# Patient Record
Sex: Male | Born: 1975 | Race: Black or African American | Hispanic: No | Marital: Married | Smoking: Never smoker
Health system: Southern US, Community
[De-identification: ages and names within clinical notes are randomized; demographics above are authoritative.]

## PROBLEM LIST (undated history)

## (undated) DIAGNOSIS — E119 Type 2 diabetes mellitus without complications: Secondary | ICD-10-CM

## (undated) DIAGNOSIS — Z6841 Body Mass Index (BMI) 40.0 and over, adult: Secondary | ICD-10-CM

## (undated) DIAGNOSIS — E7800 Pure hypercholesterolemia, unspecified: Secondary | ICD-10-CM

## (undated) DIAGNOSIS — I10 Essential (primary) hypertension: Secondary | ICD-10-CM

## (undated) HISTORY — DX: Morbid (severe) obesity due to excess calories: E66.01

## (undated) HISTORY — DX: Essential (primary) hypertension: I10

## (undated) HISTORY — PX: WISDOM TOOTH EXTRACTION: SHX21

## (undated) HISTORY — DX: Body Mass Index (BMI) 40.0 and over, adult: Z684

## (undated) HISTORY — DX: Pure hypercholesterolemia, unspecified: E78.00

## (undated) HISTORY — DX: Type 2 diabetes mellitus without complications: E11.9

---

## 2013-08-18 ENCOUNTER — Ambulatory Visit
Admission: RE | Admit: 2013-08-18 | Discharge: 2013-08-18 | Disposition: A | Discharge status: Home / Self Care | Payer: Managed Care, Other (non HMO) | Source: Ambulatory Visit | Attending: Family Medicine | Admitting: Family Medicine

## 2013-08-18 ENCOUNTER — Other Ambulatory Visit: Payer: Self-pay | Admitting: Family Medicine

## 2013-08-18 DIAGNOSIS — M79604 Pain in right leg: Secondary | ICD-10-CM

## 2014-04-25 ENCOUNTER — Other Ambulatory Visit: Payer: Self-pay | Admitting: Family Medicine

## 2014-04-25 ENCOUNTER — Ambulatory Visit
Admission: RE | Admit: 2014-04-25 | Discharge: 2014-04-25 | Disposition: A | Discharge status: Home / Self Care | Payer: Managed Care, Other (non HMO) | Source: Ambulatory Visit | Attending: Family Medicine | Admitting: Family Medicine

## 2014-04-25 ENCOUNTER — Encounter (INDEPENDENT_AMBULATORY_CARE_PROVIDER_SITE_OTHER): Payer: Self-pay

## 2014-04-25 DIAGNOSIS — M7989 Other specified soft tissue disorders: Secondary | ICD-10-CM

## 2014-04-26 ENCOUNTER — Other Ambulatory Visit: Payer: 59

## 2016-12-28 ENCOUNTER — Telehealth: Payer: Self-pay

## 2016-12-28 NOTE — Telephone Encounter (Signed)
SENT NOTES TO SCHEDULING 

## 2017-01-07 ENCOUNTER — Ambulatory Visit (INDEPENDENT_AMBULATORY_CARE_PROVIDER_SITE_OTHER): Payer: Managed Care, Other (non HMO) | Admitting: Physician Assistant

## 2017-01-07 ENCOUNTER — Encounter: Payer: Self-pay | Admitting: Physician Assistant

## 2017-01-07 VITALS — BP 132/88 | HR 95 | Ht 75.0 in | Wt 328.8 lb

## 2017-01-07 DIAGNOSIS — R079 Chest pain, unspecified: Secondary | ICD-10-CM | POA: Diagnosis not present

## 2017-01-07 DIAGNOSIS — E7800 Pure hypercholesterolemia, unspecified: Secondary | ICD-10-CM

## 2017-01-07 DIAGNOSIS — E119 Type 2 diabetes mellitus without complications: Secondary | ICD-10-CM | POA: Insufficient documentation

## 2017-01-07 DIAGNOSIS — I10 Essential (primary) hypertension: Secondary | ICD-10-CM | POA: Diagnosis not present

## 2017-01-07 NOTE — Patient Instructions (Signed)
Medication Instructions:  Continue current medications  Labwork: None Ordered  Testing/Procedures: Your physician has requested that you have an echocardiogram. Echocardiography is a painless test that uses sound waves to create images of your heart. It provides your doctor with information about the size and shape of your heart and how well your heart's chambers and valves are working. This procedure takes approximately one hour. There are no restrictions for this procedure.  Your physician has requested that you have a Coronary Calcium Score.  Follow-Up: Your physician recommends that you schedule a follow-up appointment in: 6 Months with Dr Tresa EndoKelly   Any Other Special Instructions Will Be Listed Below (If Applicable).   If you need a refill on your cardiac medications before your next appointment, please call your pharmacy.

## 2017-01-07 NOTE — Progress Notes (Signed)
Cardiology Office Note   Date:  01/07/2017   ID:  Joel Wiley, DOB 03/30/1976, MRN [id number]  PCP:  No primary care provider on file.  Cardiologist:  Georgena Spurling, PA-C   Chief Complaint  Patient presents with  . Chest Pain  . Numbness    left arm    History of Present Illness: Joel Wiley is a 41 y.o. male with a history of "hole in his heart" found on echo 2003 in [place], DM, HTN, HLD, obesity.   Joel Wiley presents for cardiology evaluation.  He walks for exercise when the weather is bad and lifts weights 2 x week. He used to weigh more, peak weight 375. He is trying to do better. His exertion level has been normal.   He had some chest pain in November. It was a pressure and got sharp at times. The pressure could last up to 30 minutes. It was random, no association with meals or exercise. Sometimes will get sx the day after eating a big steak. In November/December 5-6 episodes total. He has not tried any rx for this. He can eat steak without symptoms the next day if he drinks a great deal of water (but has nocturia then).  He has also had some L arm tingling. It will occur early in the morning, just getting up. He has also had it while lifting weights. He has 10-20 episodes. He will take ibuprofen occasionally. He does not take it much because he is concerned about GIB since he is on ASA.  He does not have any palpitations, no presyncope or syncope. No consistent LE edema. No consistent exertional symptoms.   He is trying to do better with his DM diet and meds. He is not having any GI symptoms. No bleeding issues.    Past Medical History:  Diagnosis Date  . Diabetes (HCC)   . High cholesterol   . Hypertension   . Morbid obesity with BMI of 40.0-44.9, adult [facility])     Past Surgical History:  Procedure Laterality Date  . WISDOM TOOTH EXTRACTION      Current Outpatient Prescriptions  Medication Sig Dispense Refill  . insulin detemir (LEVEMIR)  100 UNIT/ML injection Inject 20 Units into the skin at bedtime.    Marland Kitchen lisinopril (PRINIVIL,ZESTRIL) 5 MG tablet Take 5 mg by mouth daily.    . metFORMIN (GLUCOPHAGE) 500 MG tablet Take 500 mg by mouth 2 (two) times daily with a meal.    . pravastatin (PRAVACHOL) 20 MG tablet Take 20 mg by mouth daily.     No current facility-administered medications for this visit.     Allergies:   Patient has no known allergies.    Social History:  The patient  reports that he has never smoked. He has never used smokeless tobacco.   Family History:  The patient's family history includes Heart disease in his father; Hyperlipidemia in his father and mother; Hypertension in his father and mother.    ROS:  Please see the history of present illness. All other systems are reviewed and negative.    PHYSICAL EXAM: VS:  BP 132/88 (BP Location: Right Arm)   Pulse 95   Ht 6\' 3"  (1.905 m)   Wt (!) 328 lb 12.8 oz (149.1 kg)   BMI 41.10 kg/m  , BMI Body mass index is 41.1 kg/m. GEN: Well nourished, well developed, male in no acute distress  HEENT: normal for age  Neck: no JVD, no carotid bruit, no  masses Cardiac: RRR; no murmur, no rubs, or gallops Respiratory:  clear to auscultation bilaterally, normal work of breathing GI: soft, nontender, nondistended, + BS MS: no deformity or atrophy; trace LE edema; distal pulses are 2+ in all 4 extremities   Skin: warm and dry, no rash Neuro:  Strength and sensation are intact Psych: euthymic mood, full affect   EKG:  EKG is ordered today. The ekg ordered today demonstrates SR, normal intervals, no ischemic changes   Recent Labs: No results found for requested labs within last 8760 hours.    Lipid Panel No results found for: CHOL, TRIG, HDL, CHOLHDL, VLDL, LDLCALC, LDLDIRECT   Wt Readings from Last 3 Encounters:  01/07/17 (!) 328 lb 12.8 oz (149.1 kg)     Other studies Reviewed: Additional studies/ records that were reviewed today include: Awaiting  faxed records.  ASSESSMENT AND PLAN: Pt and plan reviewed with Dr Tresa EndoKelly, who agrees.  1.  Chest pain, moderate CAD risk: He is not having consistent ischemic symptoms, but is high risk for CAD. Will check a calcium score, if that is zero, no further ischemic eval. If his calcium score is significantly abnormal, may just need cath.  2. HTN: BP is a little high, but he is a little stressed about being here. Management per Dr Allyne GeeSanders.  3. ?ASD or VSD: Check echo, no obvious murmur  Current medicines are reviewed at length with the patient today.  The patient does not have concerns regarding medicines.  The following changes have been made:  no change  Labs/ tests ordered today include:   Orders Placed This Encounter  Procedures  . CT CARDIAC SCORING  . EKG 12-Lead  . ECHOCARDIOGRAM COMPLETE     Disposition:   FU with Dr Tresa EndoKelly in 6 months, sooner if testing is abnormal  Signed, Leanna BattlesBarrett, Lorieann Argueta, PA-C  01/07/2017 6:30 PM    [facility] Phone: [phone]; Fax: [phone]  This note was written with the assistance of speech recognition software. Please excuse any transcriptional errors.

## 2017-01-13 ENCOUNTER — Ambulatory Visit (HOSPITAL_COMMUNITY): Payer: Managed Care, Other (non HMO) | Attending: Internal Medicine

## 2017-01-13 ENCOUNTER — Other Ambulatory Visit: Payer: Self-pay

## 2017-01-13 DIAGNOSIS — I10 Essential (primary) hypertension: Secondary | ICD-10-CM

## 2017-01-13 DIAGNOSIS — Z6841 Body Mass Index (BMI) 40.0 and over, adult: Secondary | ICD-10-CM | POA: Insufficient documentation

## 2017-01-13 DIAGNOSIS — I119 Hypertensive heart disease without heart failure: Secondary | ICD-10-CM | POA: Diagnosis not present

## 2017-01-13 DIAGNOSIS — R079 Chest pain, unspecified: Secondary | ICD-10-CM | POA: Insufficient documentation

## 2017-01-13 DIAGNOSIS — E785 Hyperlipidemia, unspecified: Secondary | ICD-10-CM | POA: Insufficient documentation

## 2017-01-13 DIAGNOSIS — E119 Type 2 diabetes mellitus without complications: Secondary | ICD-10-CM | POA: Insufficient documentation

## 2017-01-13 MED ORDER — PERFLUTREN LIPID MICROSPHERE
1.0000 mL | INTRAVENOUS | Status: AC | PRN
  Administered 2017-01-13: 2 mL via INTRAVENOUS

## 2017-01-15 ENCOUNTER — Ambulatory Visit (INDEPENDENT_AMBULATORY_CARE_PROVIDER_SITE_OTHER)
Admission: RE | Admit: 2017-01-15 | Discharge: 2017-01-15 | Disposition: A | Discharge status: Home / Self Care | Payer: Self-pay | Source: Ambulatory Visit | Attending: Physician Assistant | Admitting: Physician Assistant

## 2017-01-15 DIAGNOSIS — R079 Chest pain, unspecified: Secondary | ICD-10-CM

## 2017-06-22 ENCOUNTER — Other Ambulatory Visit: Payer: Self-pay | Admitting: Nurse Practitioner

## 2017-06-22 DIAGNOSIS — R229 Localized swelling, mass and lump, unspecified: Secondary | ICD-10-CM

## 2017-06-24 ENCOUNTER — Other Ambulatory Visit: Payer: Managed Care, Other (non HMO)

## 2017-06-28 ENCOUNTER — Ambulatory Visit
Admission: RE | Admit: 2017-06-28 | Discharge: 2017-06-28 | Disposition: A | Discharge status: Home / Self Care | Payer: Managed Care, Other (non HMO) | Source: Ambulatory Visit | Attending: Nurse Practitioner | Admitting: Nurse Practitioner

## 2017-06-28 DIAGNOSIS — R229 Localized swelling, mass and lump, unspecified: Secondary | ICD-10-CM

## 2017-11-10 IMAGING — CT CT HEART SCORING
2 series · 16 of 20 positions shown, 18 images · non-contrast
Comparison: None.

CLINICAL DATA: Risk stratification

EXAM:
Coronary Calcium Score
TECHNIQUE: The patient was scanned on a Siemens Somatom 64 slice scanner. Axial
non-contrast 3 mm slices were carried out through the heart. The
data set was analyzed on a dedicated work station and scored using
the Agatson method.

[Series 2: casc 3.0 i36f 2 bestdiast 64 % · axial · 0.38mm/px · z∈[-258,-135]mm · 8 of 53 slices shown, 10 images]
[im 6/53  vessel]
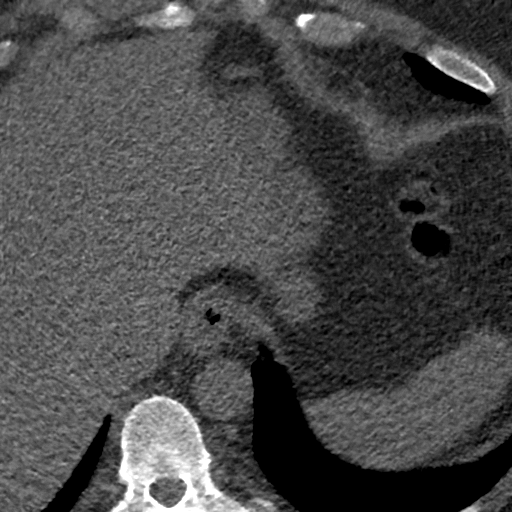
[im 6/53  lung]
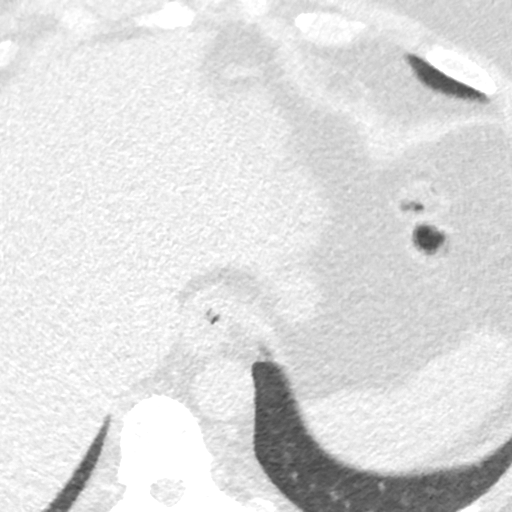
[im 12/53  vessel]
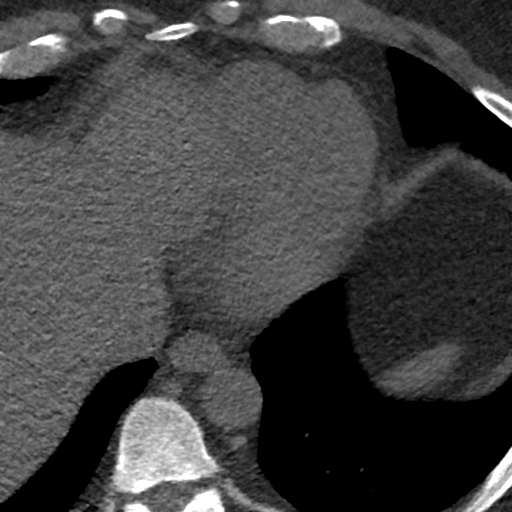
[im 18/53  vessel]
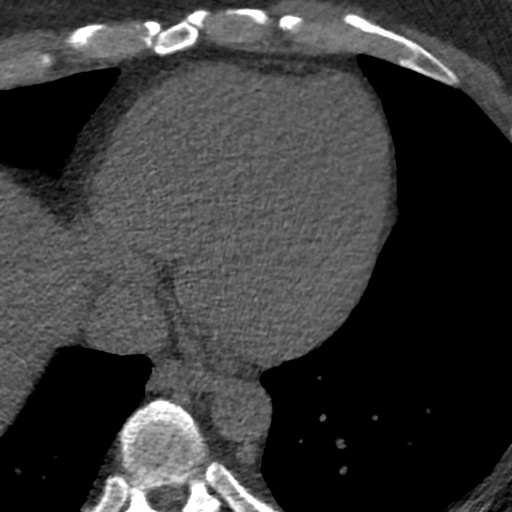
[im 24/53  vessel]
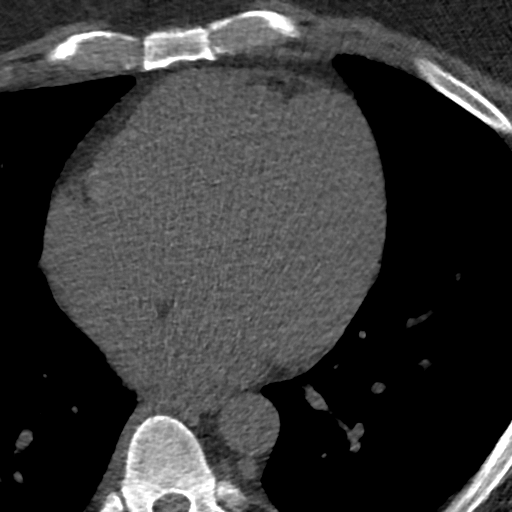
[im 29/53  vessel]
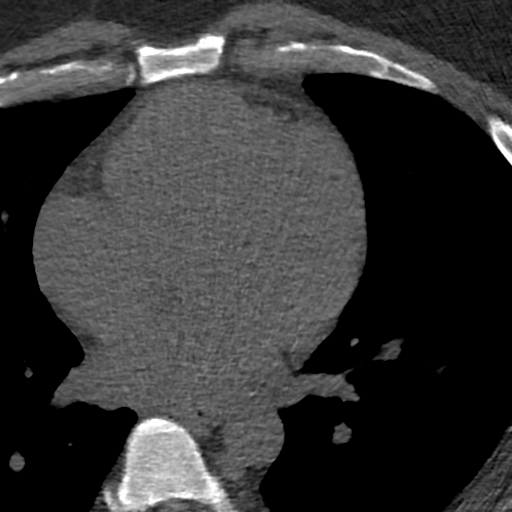
[im 29/53  lung]
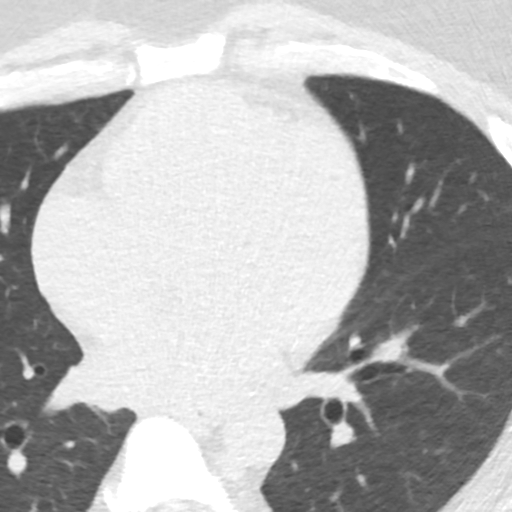
[im 35/53  vessel]
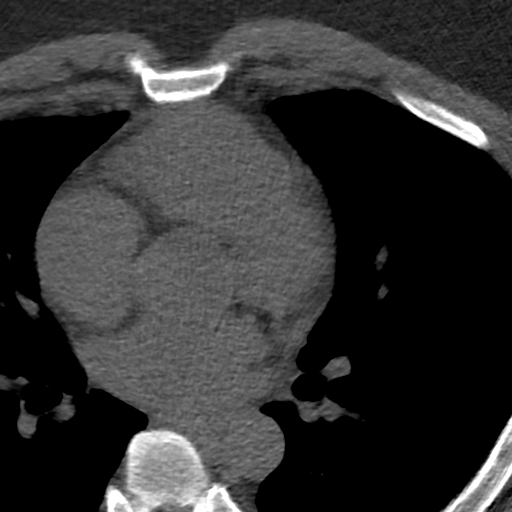
[im 41/53  vessel]
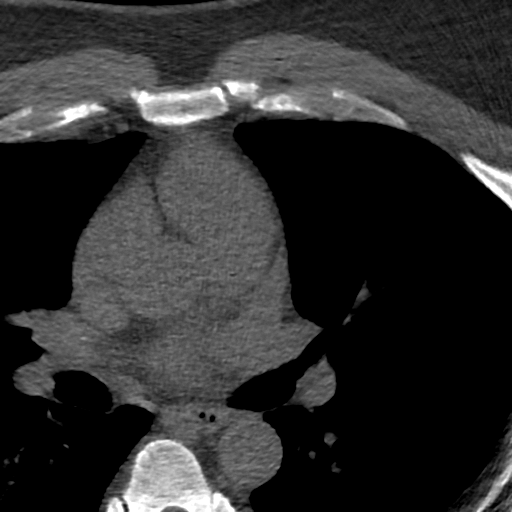
[im 47/53  vessel]
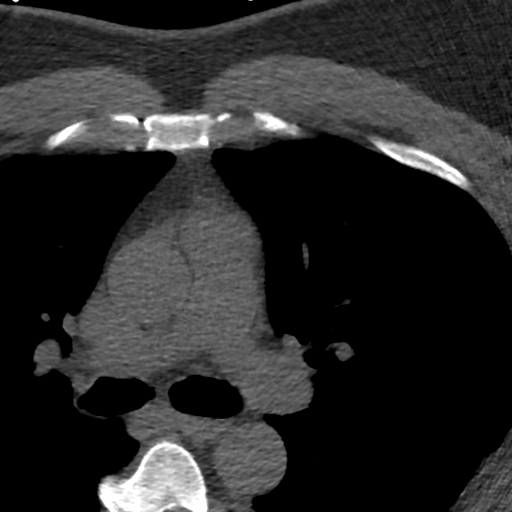

[Series 4: lung st 63 % · axial · 0.74mm/px · z∈[-258,-135]mm · 8 of 53 slices shown]
[im 6/53  lung]
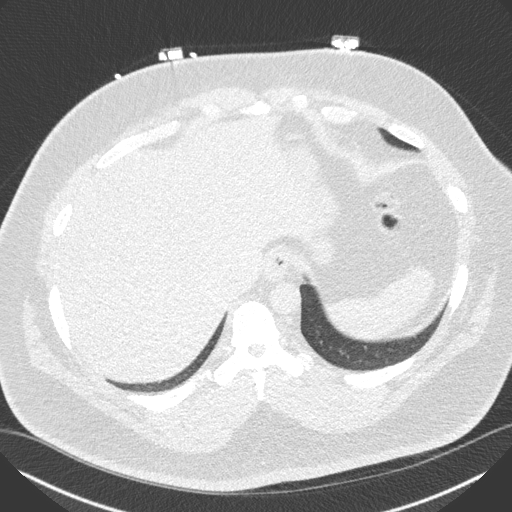
[im 12/53  lung]
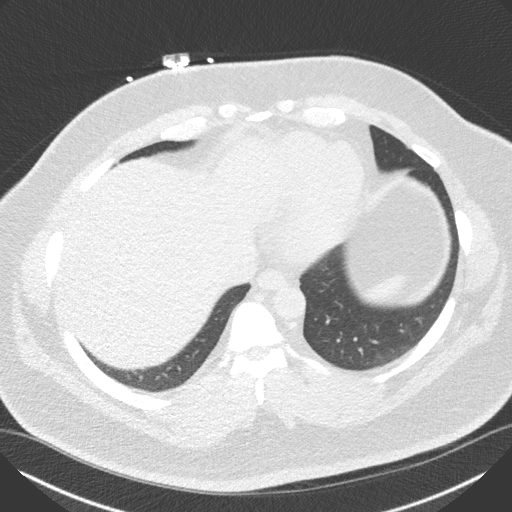
[im 18/53  lung]
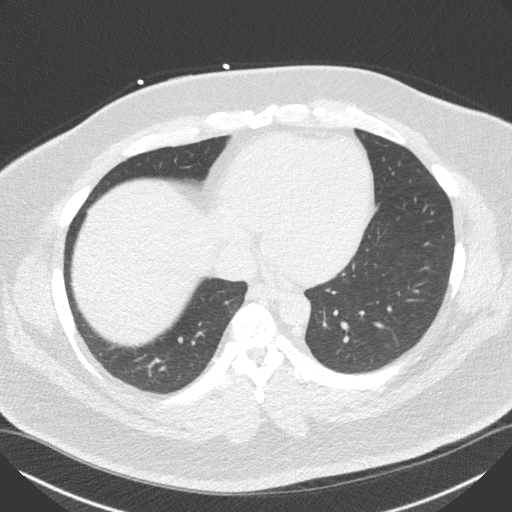
[im 24/53  lung]
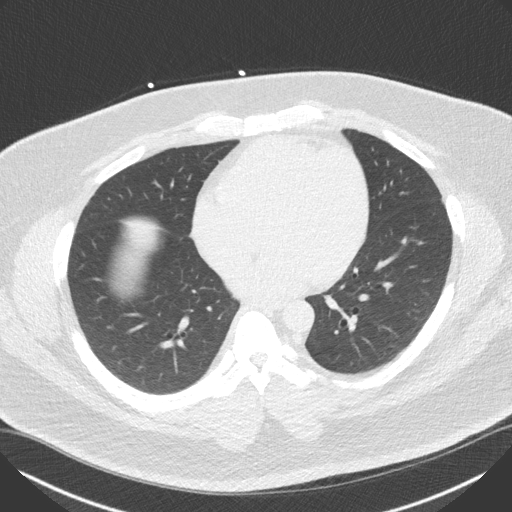
[im 29/53  lung]
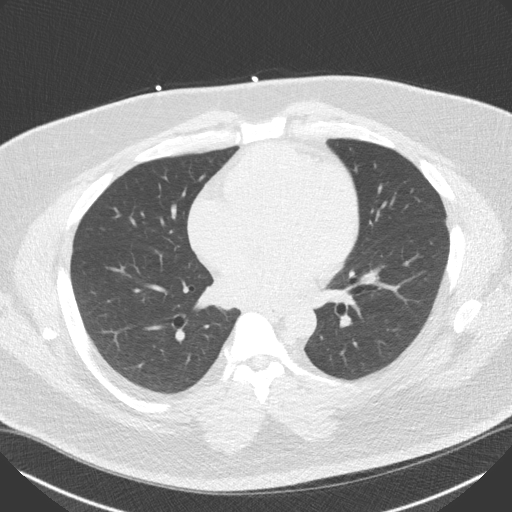
[im 35/53  lung]
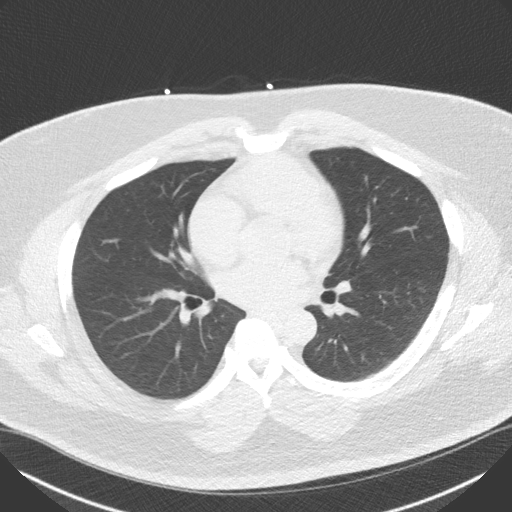
[im 41/53  lung]
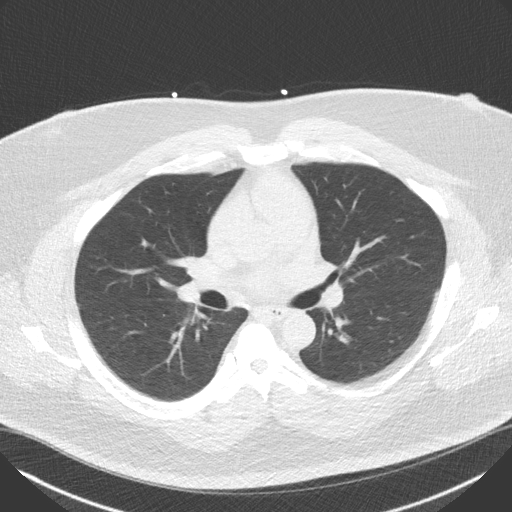
[im 47/53  lung]
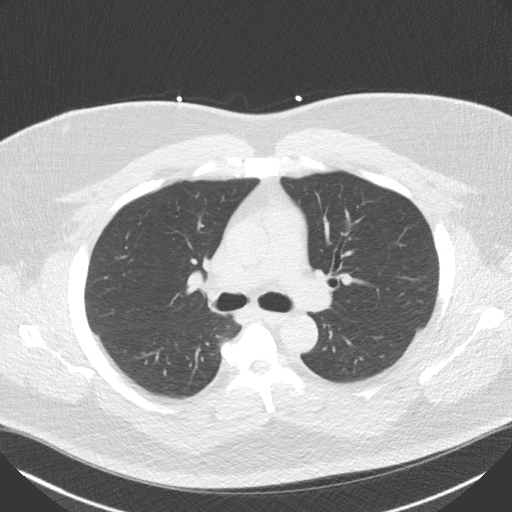

[16 of 20 positions shown; findings below may reference images not displayed]

FINDINGS: Non-cardiac: See separate report from [REDACTED].

Ascending Aorta:  Normal size.  No calcifications.

Pericardium: Normal.

Coronary arteries:  Normal origin.
IMPRESSION: Coronary calcium score of 0. This was 0 percentile for age and sex
matched control.

Krishna Brunner

EXAM:
OVER-READ INTERPRETATION  CT CHEST

The following report is an over-read performed by radiologist Dr.
Amador Schauer [REDACTED] on 01/15/2017. This over-read
does not include interpretation of cardiac or coronary anatomy or
pathology. The coronary calcium score interpretation by the
cardiologist is attached.
FINDINGS: Cardiovascular: Heart is normal size. Visualized aorta normal
caliber.

Mediastinum/Nodes: No adenopathy in the visualized lower mediastinum
or hila.

Lungs/Pleura: Visualized lungs are clear.  No effusions.

Upper Abdomen: Imaging into the upper abdomen shows no acute
findings.

Musculoskeletal: Chest wall soft tissues are unremarkable. No acute
bony abnormality.
IMPRESSION: No acute or significant extracardiac abnormality.

## 2018-01-18 DIAGNOSIS — I10 Essential (primary) hypertension: Secondary | ICD-10-CM | POA: Diagnosis not present

## 2018-01-18 DIAGNOSIS — Z79899 Other long term (current) drug therapy: Secondary | ICD-10-CM | POA: Diagnosis not present

## 2018-01-18 DIAGNOSIS — E1165 Type 2 diabetes mellitus with hyperglycemia: Secondary | ICD-10-CM | POA: Diagnosis not present

## 2018-07-25 DIAGNOSIS — E1165 Type 2 diabetes mellitus with hyperglycemia: Secondary | ICD-10-CM | POA: Diagnosis not present

## 2018-07-25 DIAGNOSIS — I10 Essential (primary) hypertension: Secondary | ICD-10-CM | POA: Diagnosis not present

## 2018-07-25 DIAGNOSIS — Z0000 Encounter for general adult medical examination without abnormal findings: Secondary | ICD-10-CM | POA: Diagnosis not present

## 2018-11-24 ENCOUNTER — Ambulatory Visit: Payer: Self-pay | Admitting: Internal Medicine

## 2018-12-16 ENCOUNTER — Other Ambulatory Visit: Payer: Self-pay | Admitting: Internal Medicine

## 2019-01-01 ENCOUNTER — Other Ambulatory Visit: Payer: Self-pay | Admitting: Internal Medicine

## 2019-02-22 ENCOUNTER — Encounter: Payer: Self-pay | Admitting: Internal Medicine

## 2019-02-22 ENCOUNTER — Other Ambulatory Visit: Payer: Self-pay

## 2019-02-22 ENCOUNTER — Ambulatory Visit: Payer: Commercial Managed Care - PPO | Admitting: Internal Medicine

## 2019-02-22 VITALS — BP 138/78 | HR 91 | Temp 97.8°F | Ht 73.8 in | Wt 328.0 lb

## 2019-02-22 DIAGNOSIS — I10 Essential (primary) hypertension: Secondary | ICD-10-CM | POA: Diagnosis not present

## 2019-02-22 DIAGNOSIS — E7800 Pure hypercholesterolemia, unspecified: Secondary | ICD-10-CM | POA: Diagnosis not present

## 2019-02-22 DIAGNOSIS — M542 Cervicalgia: Secondary | ICD-10-CM | POA: Diagnosis not present

## 2019-02-22 DIAGNOSIS — E1165 Type 2 diabetes mellitus with hyperglycemia: Secondary | ICD-10-CM | POA: Diagnosis not present

## 2019-02-22 DIAGNOSIS — E1169 Type 2 diabetes mellitus with other specified complication: Secondary | ICD-10-CM

## 2019-02-22 MED ORDER — SEMAGLUTIDE(0.25 OR 0.5MG/DOS) 2 MG/1.5ML ~~LOC~~ SOPN: 0.5000 mg | PEN_INJECTOR | SUBCUTANEOUS | 1 refills | Status: DC

## 2019-02-22 NOTE — Progress Notes (Signed)
Subjective:     Patient ID: Joel Wiley , male    DOB: [birth year] , 43 y.o.   MRN: [id number]   Chief Complaint  Patient presents with  . Diabetes    HPI  Pt is here DM FU. Admits he has eaten at lot during the [place] and just started changing his earing habits last week.  Glucose  Has been from 124 fasting -180 post prandial.    Past Medical History:  Diagnosis Date  . Diabetes (HCC)   . High cholesterol   . Hypertension   . Morbid obesity with BMI of 40.0-44.9, adult (HCC)      Family History  Problem Relation Age of Onset  . Hyperlipidemia Mother   . Hypertension Mother   . Heart disease Father        heavy smoker  . Hypertension Father   . Hyperlipidemia Father      Current Outpatient Medications:  .  lisinopril (PRINIVIL,ZESTRIL) 5 MG tablet, Take 5 mg by mouth daily., Disp: , Rfl:  .  metFORMIN (GLUCOPHAGE) 500 MG tablet, Take 500 mg by mouth 2 (two) times daily with a meal., Disp: , Rfl:  .  pravastatin (PRAVACHOL) 20 MG tablet, Take 20 mg by mouth daily., Disp: , Rfl:  .  Semaglutide,0.25 or 0.5MG /DOS, (OZEMPIC, 0.25 OR 0.5 MG/DOSE,) 2 MG/1.5ML SOPN, Inject 0.5 mg into the skin once a week., Disp: 3 pen, Rfl: 1 .  insulin detemir (LEVEMIR) 100 UNIT/ML injection, Inject 20 Units into the skin at bedtime., Disp: , Rfl:    No Known Allergies   Review of Systems  Constitutional: Negative for diaphoresis.  HENT: Negative for congestion.   Respiratory: Negative for cough, chest tightness and shortness of breath.   Cardiovascular: Negative for chest pain, palpitations and leg swelling.  Gastrointestinal: Positive for nausea. Negative for constipation, diarrhea and vomiting.       On occasion gets nausea when he has to have a BM x 1y.   Endocrine: Negative for polydipsia, polyphagia and polyuria.  Musculoskeletal: Positive for neck pain.       Radiates to L shoulder x 8-9 months since wt lifting. He not been evaluated for this.   Skin: Negative for rash and  wound.  Allergic/Immunologic:       Due to d/c coffee  Neurological: Positive for headaches. Negative for numbness.  Hematological: Negative for adenopathy.   Today's Vitals   02/22/19 0900  BP: 138/78  Pulse: 91  Temp: 97.8 F (36.6 C)  TempSrc: Oral  SpO2: 98%  Weight: (!) 328 lb (148.8 kg)  Height: 6' 1.8" (1.875 m)   Body mass index is 42.34 kg/m.  Last wt was 311 lb. From 8/19  Objective:  Physical Exam  Constitutional: he is oriented to person, place, and time. She appears well-developed and well-nourished. No distress.  HENT:  Head: Normocephalic and atraumatic.  Right Ear: External ear normal.  Left Ear: External ear normal.  Nose: Nose normal.  Eyes: Conjunctivae are normal. Right eye exhibits no discharge. Left eye exhibits no discharge. No scleral icterus.  Neck: Neck supple. No thyromegaly present.  No carotid bruits bilaterally  Cardiovascular: Normal rate and regular rhythm.  No murmur heard. Pulmonary/Chest: Effort normal and breath sounds normal. No respiratory distress.  Musculoskeletal: Normal range of motion. he exhibits no edema.  Lymphadenopathy: he has no cervical adenopathy.  Neurological: he is alert and oriented to person, place, and time.  Skin: Skin is warm and dry. Capillary refill takes less  than 2 seconds. No rash noted. he is not diaphoretic.  Psychiatric: he has a normal mood and affect. Her behavior is normal. Judgment and thought content normal.  Nursing note reviewed.   Assessment And Plan:    1. Essential hypertension- stable. May continue same med.  - CMP14 + Anion Gap - CBC no Diff  2. Uncontrolled type 2 diabetes mellitus with hyperglycemia (HCC) Chronic. May continue same med and change if lab is abnormal.  - Hemoglobin A1c    Fu with his PCP in 3-5 months.  3. High cholesterol- chronic - Lipid Profile  4. Neck pain- new. Will come back for this evaluation.      FU in 1-2 weeks to address neck pain.   Encarnacion Scioneaux  RODRIGUEZ-SOUTHWORTH, PA-C

## 2019-02-22 NOTE — Patient Instructions (Signed)
Carbohydrate Counting for Diabetes Mellitus, Adult  Carbohydrate counting is a method of keeping track of how many carbohydrates you eat. Eating carbohydrates naturally increases the amount of sugar (glucose) in the blood. Counting how many carbohydrates you eat helps keep your blood glucose within normal limits, which helps you manage your diabetes (diabetes mellitus). It is important to know how many carbohydrates you can safely have in each meal. This is different for every person. A diet and nutrition specialist (registered dietitian) can help you make a meal plan and calculate how many carbohydrates you should have at each meal and snack. Carbohydrates are found in the following foods:  Grains, such as breads and cereals.  Dried beans and soy products.  Starchy vegetables, such as potatoes, peas, and corn.  Fruit and fruit juices.  Milk and yogurt.  Sweets and snack foods, such as cake, cookies, candy, chips, and soft drinks. How do I count carbohydrates? There are two ways to count carbohydrates in food. You can use either of the methods or a combination of both. Reading "Nutrition Facts" on packaged food The "Nutrition Facts" list is included on the labels of almost all packaged foods and beverages in the [place] It includes:  The serving size.  Information about nutrients in each serving, including the grams (g) of carbohydrate per serving. To use the "Nutrition Facts":  Decide how many servings you will have.  Multiply the number of servings by the number of carbohydrates per serving.  The resulting number is the total amount of carbohydrates that you will be having. Learning standard serving sizes of other foods When you eat carbohydrate foods that are not packaged or do not include "Nutrition Facts" on the label, you need to measure the servings in order to count the amount of carbohydrates:  Measure the foods that you will eat with a food scale or measuring cup, if needed.   Decide how many standard-size servings you will eat.  Multiply the number of servings by 15. Most carbohydrate-rich foods have about 15 g of carbohydrates per serving. ? For example, if you eat 8 oz (170 g) of strawberries, you will have eaten 2 servings and 30 g of carbohydrates (2 servings x 15 g = 30 g).  For foods that have more than one food mixed, such as soups and casseroles, you must count the carbohydrates in each food that is included. The following list contains standard serving sizes of common carbohydrate-rich foods. Each of these servings has about 15 g of carbohydrates:   hamburger bun or  English muffin.   oz (15 mL) syrup.   oz (14 g) jelly.  1 slice of bread.  1 six-inch tortilla.  3 oz (85 g) cooked rice or pasta.  4 oz (113 g) cooked dried beans.  4 oz (113 g) starchy vegetable, such as peas, corn, or potatoes.  4 oz (113 g) hot cereal.  4 oz (113 g) mashed potatoes or  of a large baked potato.  4 oz (113 g) canned or frozen fruit.  4 oz (120 mL) fruit juice.  4-6 crackers.  6 chicken nuggets.  6 oz (170 g) unsweetened dry cereal.  6 oz (170 g) plain fat-free yogurt or yogurt sweetened with artificial sweeteners.  8 oz (240 mL) milk.  8 oz (170 g) fresh fruit or one small piece of fruit.  24 oz (680 g) popped popcorn. Example of carbohydrate counting Sample meal  3 oz (85 g) chicken breast.  6 oz (170 g)   brown rice.  4 oz (113 g) corn.  8 oz (240 mL) milk.  8 oz (170 g) strawberries with sugar-free whipped topping. Carbohydrate calculation 1. Identify the foods that contain carbohydrates: ? Rice. ? Corn. ? Milk. ? Strawberries. 2. Calculate how many servings you have of each food: ? 2 servings rice. ? 1 serving corn. ? 1 serving milk. ? 1 serving strawberries. 3. Multiply each number of servings by 15 g: ? 2 servings rice x 15 g = 30 g. ? 1 serving corn x 15 g = 15 g. ? 1 serving milk x 15 g = 15 g. ? 1 serving  strawberries x 15 g = 15 g. 4. Add together all of the amounts to find the total grams of carbohydrates eaten: ? 30 g + 15 g + 15 g + 15 g = 75 g of carbohydrates total. Summary  Carbohydrate counting is a method of keeping track of how many carbohydrates you eat.  Eating carbohydrates naturally increases the amount of sugar (glucose) in the blood.  Counting how many carbohydrates you eat helps keep your blood glucose within normal limits, which helps you manage your diabetes.  A diet and nutrition specialist (registered dietitian) can help you make a meal plan and calculate how many carbohydrates you should have at each meal and snack. This information is not intended to replace advice given to you by your health care provider. Make sure you discuss any questions you have with your health care provider. Document Released: 11/23/2005 Document Revised: 06/02/2017 Document Reviewed: 05/06/2016 Elsevier Interactive Patient Education  2019 Elsevier Inc.  

## 2019-02-23 ENCOUNTER — Other Ambulatory Visit: Payer: Self-pay | Admitting: Internal Medicine

## 2019-02-23 LAB — LIPID PANEL
CHOLESTEROL TOTAL: 140 mg/dL (ref 100–199)
Chol/HDL Ratio: 3.5 ratio (ref 0.0–5.0)
HDL: 40 mg/dL (ref >39)
LDL Calculated: 84 mg/dL (ref 0–99)
Triglycerides: 82 mg/dL (ref 0–149)
VLDL CHOLESTEROL CAL: 16 mg/dL (ref 5–40)

## 2019-02-23 LAB — CBC
HEMATOCRIT: 43.4 % (ref 37.5–51.0)
HEMOGLOBIN: 15.1 g/dL (ref 13.0–17.7)
MCH: 31.6 pg (ref 26.6–33.0)
MCHC: 34.8 g/dL (ref 31.5–35.7)
MCV: 91 fL (ref 79–97)
Platelets: 175 10*3/uL (ref 150–450)
RBC: 4.78 x10E6/uL (ref 4.14–5.80)
RDW: 11.9 % (ref 11.6–15.4)
WBC: 7.9 10*3/uL (ref 3.4–10.8)

## 2019-02-23 LAB — CMP14 + ANION GAP
A/G RATIO: 1.4 (ref 1.2–2.2)
ALK PHOS: 41 IU/L (ref 39–117)
ALT: 30 IU/L (ref 0–44)
AST: 18 IU/L (ref 0–40)
Albumin: 4.4 g/dL (ref 4.0–5.0)
Anion Gap: 16 mmol/L (ref 10.0–18.0)
BILIRUBIN TOTAL: 0.7 mg/dL (ref 0.0–1.2)
BUN/Creatinine Ratio: 12 (ref 9–20)
BUN: 14 mg/dL (ref 6–24)
CHLORIDE: 101 mmol/L (ref 96–106)
CO2: 22 mmol/L (ref 20–29)
Calcium: 9.5 mg/dL (ref 8.7–10.2)
Creatinine, Ser: 1.13 mg/dL (ref 0.76–1.27)
GFR calc Af Amer: 90 mL/min/{1.73_m2} (ref >59)
GFR calc non Af Amer: 78 mL/min/{1.73_m2} (ref >59)
GLUCOSE: 120 mg/dL — AB (ref 65–99)
Globulin, Total: 3.1 g/dL (ref 1.5–4.5)
POTASSIUM: 4.3 mmol/L (ref 3.5–5.2)
Sodium: 139 mmol/L (ref 134–144)
Total Protein: 7.5 g/dL (ref 6.0–8.5)

## 2019-02-23 LAB — HEMOGLOBIN A1C
Est. average glucose Bld gHb Est-mCnc: 171 mg/dL
HEMOGLOBIN A1C: 7.6 % — AB (ref 4.8–5.6)

## 2019-02-23 MED ORDER — SEMAGLUTIDE (1 MG/DOSE) 2 MG/1.5ML ~~LOC~~ SOPN: 1.0000 mg | PEN_INJECTOR | SUBCUTANEOUS | 2 refills | Status: DC

## 2019-02-27 ENCOUNTER — Other Ambulatory Visit: Payer: Self-pay | Admitting: Internal Medicine

## 2019-03-08 ENCOUNTER — Ambulatory Visit: Payer: Commercial Managed Care - PPO | Admitting: Internal Medicine

## 2019-04-30 ENCOUNTER — Other Ambulatory Visit: Payer: Self-pay | Admitting: Nurse Practitioner

## 2019-05-30 ENCOUNTER — Encounter: Payer: Self-pay | Admitting: Internal Medicine

## 2019-05-30 ENCOUNTER — Ambulatory Visit (INDEPENDENT_AMBULATORY_CARE_PROVIDER_SITE_OTHER): Payer: Commercial Managed Care - PPO | Admitting: Internal Medicine

## 2019-05-30 VITALS — BP 112/84 | HR 92 | Temp 98.4°F | Ht 73.8 in | Wt 322.0 lb

## 2019-05-30 DIAGNOSIS — Z23 Encounter for immunization: Secondary | ICD-10-CM

## 2019-05-30 DIAGNOSIS — Z6841 Body Mass Index (BMI) 40.0 and over, adult: Secondary | ICD-10-CM

## 2019-05-30 DIAGNOSIS — E1165 Type 2 diabetes mellitus with hyperglycemia: Secondary | ICD-10-CM

## 2019-05-30 DIAGNOSIS — I10 Essential (primary) hypertension: Secondary | ICD-10-CM

## 2019-05-30 MED ORDER — TETANUS-DIPHTH-ACELL PERTUSSIS 5-2.5-18.5 LF-MCG/0.5 IM SUSP
0.5000 mL | Freq: Once | INTRAMUSCULAR | Status: AC
  Administered 2019-05-30: 0.5 mL via INTRAMUSCULAR

## 2019-05-30 NOTE — Patient Instructions (Addendum)

## 2019-05-31 LAB — BMP8+EGFR
BUN/Creatinine Ratio: 13 (ref 9–20)
BUN: 15 mg/dL (ref 6–24)
CO2: 22 mmol/L (ref 20–29)
Calcium: 9.9 mg/dL (ref 8.7–10.2)
Chloride: 101 mmol/L (ref 96–106)
Creatinine, Ser: 1.15 mg/dL (ref 0.76–1.27)
GFR calc Af Amer: 88 mL/min/{1.73_m2} (ref >59)
GFR calc non Af Amer: 76 mL/min/{1.73_m2} (ref >59)
Glucose: 106 mg/dL — ABNORMAL HIGH (ref 65–99)
Potassium: 4.4 mmol/L (ref 3.5–5.2)
Sodium: 141 mmol/L (ref 134–144)

## 2019-05-31 LAB — HEMOGLOBIN A1C
Est. average glucose Bld gHb Est-mCnc: 143 mg/dL
Hgb A1c MFr Bld: 6.6 % — ABNORMAL HIGH (ref 4.8–5.6)

## 2019-06-01 NOTE — Progress Notes (Signed)
Subjective:     Patient ID: Joel Wiley , male    DOB: 16-Apr-1976 , 43 y.o.   MRN: [id number]   Chief Complaint  Patient presents with  . Diabetes  . Immunizations    Tdap  . Hypertension    HPI  He is here today for diabetes check.  He is willing to update immunizations today as well.   Diabetes He presents for his follow-up diabetic visit. He has type 2 diabetes mellitus. His disease course has been improving. There are no hypoglycemic associated symptoms. Pertinent negatives for diabetes include no blurred vision and no chest pain. There are no hypoglycemic complications. Risk factors for coronary artery disease include diabetes mellitus, dyslipidemia, hypertension, obesity and male sex. He participates in exercise intermittently. His breakfast blood glucose is taken between 8-9 am. His breakfast blood glucose range is generally 110-130 mg/dl. Eye exam is not current.  Hypertension This is a chronic problem. The current episode started more than 1 year ago. The problem has been gradually improving since onset. The problem is controlled. Pertinent negatives include no blurred vision or chest pain. The current treatment provides moderate improvement.     Past Medical History:  Diagnosis Date  . Diabetes ([place])   . High cholesterol   . Hypertension   . Morbid obesity with BMI of 40.0-44.9, adult (HCC)      Family History  Problem Relation Age of Onset  . Hyperlipidemia Mother   . Hypertension Mother   . Diabetes Mother   . Heart disease Father        heavy smoker  . Hypertension Father   . Hyperlipidemia Father      Current Outpatient Medications:  .  lisinopril (ZESTRIL) 5 MG tablet, TAKE 1 TABLET BY MOUTH EVERY DAY, Disp: 90 tablet, Rfl: 0 .  metFORMIN (GLUCOPHAGE) 500 MG tablet, Take 500 mg by mouth 2 (two) times daily with a meal., Disp: , Rfl:  .  pravastatin (PRAVACHOL) 20 MG tablet, TAKE 1 TABLET BY MOUTH EVERY DAY, Disp: 90 tablet, Rfl: 1 .  Semaglutide, 1  MG/DOSE, (OZEMPIC, 1 MG/DOSE,) 2 MG/1.5ML SOPN, Inject 1 mg into the skin once a week., Disp: 3 pen, Rfl: 2 .  insulin detemir (LEVEMIR) 100 UNIT/ML injection, Inject 20 Units into the skin at bedtime., Disp: , Rfl:    No Known Allergies   Review of Systems  Constitutional: Negative.   Eyes: Negative for blurred vision.  Respiratory: Negative.   Cardiovascular: Negative.  Negative for chest pain.  Gastrointestinal: Negative.   Neurological: Negative.   Psychiatric/Behavioral: Negative.      Today's Vitals   05/30/19 0930  BP: 112/84  Pulse: 92  Temp: 98.4 F (36.9 C)  TempSrc: Oral  Weight: (!) 322 lb (146.1 kg)  Height: 6' 1.8" (1.875 m)   Body mass index is 41.57 kg/m.   Objective:  Physical Exam Vitals signs and nursing note reviewed.  Constitutional:      Appearance: Normal appearance.  Cardiovascular:     Rate and Rhythm: Normal rate and regular rhythm.     Heart sounds: Normal heart sounds.  Pulmonary:     Effort: Pulmonary effort is normal.     Breath sounds: Normal breath sounds.  Skin:    General: Skin is warm.  Neurological:     General: No focal deficit present.     Mental Status: He is alert.  Psychiatric:        Mood and Affect: Mood normal.  Assessment And Plan:     1. Uncontrolled type 2 diabetes mellitus with hyperglycemia ([place])  I will refer him for diabetic eye exam. I will check labs as listed below. Importance of dietary AND exercise AND medication compliance was discussed with the patient.   - Ambulatory referral to Ophthalmology - BMP8+EGFR - Hemoglobin A1c  2. Essential hypertension  Well controlled. He will continue with current meds. He is encouraged to avoid adding salt to his foods.   3. Need for vaccination  - Tdap (BOOSTRIX) injection 0.5 mL  4. Class 3 severe obesity due to excess calories with serious comorbidity and body mass index (BMI) of 40.0 to 44.9 in adult [facility])  Importance of achieving optimal weight  to decrease risk of cardiovascular disease and cancers was discussed with the patient in full detail. He is encouraged to start slowly - start with 10 minutes twice daily at least three to four days per week and to gradually build to 30 minutes five days weekly. He was given tips to incorporate more activity into her daily routine - take stairs when possible, park farther away from his job, grocery stores, etc.    [name], MD    THE PATIENT IS ENCOURAGED TO PRACTICE SOCIAL DISTANCING DUE TO THE COVID-19 PANDEMIC.

## 2019-07-26 ENCOUNTER — Other Ambulatory Visit: Payer: Self-pay | Admitting: Nurse Practitioner

## 2019-08-22 ENCOUNTER — Telehealth: Payer: Self-pay

## 2019-08-22 ENCOUNTER — Other Ambulatory Visit: Payer: Self-pay

## 2019-08-22 MED ORDER — METFORMIN HCL 500 MG PO TABS: 500.0000 mg | ORAL_TABLET | Freq: Two times a day (BID) | ORAL | 0 refills | Status: DC

## 2019-08-22 NOTE — Telephone Encounter (Signed)
I have called pt to see if the pharmacy has contacted him stating his metformin was on the recall list. He stated he did receive a call and email from them about it. I advised him that we will be sending the Rx to [facility] and they should be contacting him soon to verify his info. YRL,RMA

## 2019-08-28 ENCOUNTER — Encounter: Payer: Self-pay | Admitting: Internal Medicine

## 2019-09-01 ENCOUNTER — Encounter: Payer: Self-pay | Admitting: Internal Medicine

## 2019-09-08 ENCOUNTER — Other Ambulatory Visit: Payer: Self-pay | Admitting: Internal Medicine

## 2019-09-14 ENCOUNTER — Encounter: Payer: Self-pay | Admitting: Internal Medicine

## 2019-10-04 ENCOUNTER — Other Ambulatory Visit: Payer: Self-pay | Admitting: Internal Medicine

## 2019-10-05 ENCOUNTER — Other Ambulatory Visit: Payer: Self-pay

## 2019-10-05 ENCOUNTER — Ambulatory Visit (INDEPENDENT_AMBULATORY_CARE_PROVIDER_SITE_OTHER): Payer: Commercial Managed Care - PPO | Admitting: Internal Medicine

## 2019-10-05 ENCOUNTER — Encounter: Payer: Self-pay | Admitting: Internal Medicine

## 2019-10-05 VITALS — BP 130/84 | HR 72 | Temp 98.0°F | Wt 318.0 lb

## 2019-10-05 DIAGNOSIS — E7800 Pure hypercholesterolemia, unspecified: Secondary | ICD-10-CM | POA: Diagnosis not present

## 2019-10-05 DIAGNOSIS — Z0000 Encounter for general adult medical examination without abnormal findings: Secondary | ICD-10-CM | POA: Diagnosis not present

## 2019-10-05 DIAGNOSIS — Z23 Encounter for immunization: Secondary | ICD-10-CM | POA: Diagnosis not present

## 2019-10-05 DIAGNOSIS — I10 Essential (primary) hypertension: Secondary | ICD-10-CM

## 2019-10-05 DIAGNOSIS — Z6841 Body Mass Index (BMI) 40.0 and over, adult: Secondary | ICD-10-CM

## 2019-10-05 DIAGNOSIS — E1169 Type 2 diabetes mellitus with other specified complication: Secondary | ICD-10-CM

## 2019-10-05 DIAGNOSIS — R809 Proteinuria, unspecified: Secondary | ICD-10-CM

## 2019-10-05 DIAGNOSIS — Z1211 Encounter for screening for malignant neoplasm of colon: Secondary | ICD-10-CM | POA: Diagnosis not present

## 2019-10-05 LAB — POCT URINALYSIS DIPSTICK
Bilirubin, UA: NEGATIVE
Blood, UA: NEGATIVE
Glucose, UA: NEGATIVE
Ketones, UA: NEGATIVE
Leukocytes, UA: NEGATIVE
Nitrite, UA: NEGATIVE
Protein, UA: POSITIVE — AB
Spec Grav, UA: 1.02 (ref 1.010–1.025)
Urobilinogen, UA: 0.2 E.U./dL
pH, UA: 5 (ref 5.0–8.0)

## 2019-10-05 LAB — POCT UA - MICROALBUMIN
Albumin/Creatinine Ratio, Urine, POC: 30
Creatinine, POC: 200 mg/dL
Microalbumin Ur, POC: 30 mg/L

## 2019-10-05 LAB — POC HEMOCCULT BLD/STL (OFFICE/1-CARD/DIAGNOSTIC): Fecal Occult Blood, POC: NEGATIVE

## 2019-10-05 NOTE — Addendum Note (Signed)
Addended by: Candiss Norse T on: 10/05/2019 04:26 PM   Modules accepted: Orders

## 2019-10-05 NOTE — Progress Notes (Addendum)
Subjective:     Patient ID: Joel Wiley , male    DOB: June 26, 1976 , 43 y.o.   MRN: [id number]   Chief Complaint  Patient presents with  . Annual Exam    HPI Here for yearly physical. Has stated a wt loss chalage with a friend 3 weeks ago and does cardio 20 min 3x/week, and wt lifting qd for 45 min. Last eye exam was this month, and normal.    Past Medical History:  Diagnosis Date  . Diabetes ([place])   . High cholesterol   . Hypertension   . Morbid obesity with BMI of 40.0-44.9, adult (HCC)      Family History  Problem Relation Age of Onset  . Hyperlipidemia Mother   . Hypertension Mother   . Diabetes Mother   . Heart disease Father        heavy smoker  . Hypertension Father   . Hyperlipidemia Father      Current Outpatient Medications:  .  lisinopril (ZESTRIL) 5 MG tablet, TAKE 1 TABLET BY MOUTH EVERY DAY, Disp: 90 tablet, Rfl: 0 .  metFORMIN (GLUCOPHAGE) 500 MG tablet, Take 1 tablet (500 mg total) by mouth 2 (two) times daily with a meal., Disp: 90 tablet, Rfl: 0 .  pravastatin (PRAVACHOL) 20 MG tablet, TAKE 1 TABLET BY MOUTH EVERY DAY, Disp: 90 tablet, Rfl: 1 .  Semaglutide, 1 MG/DOSE, (OZEMPIC, 1 MG/DOSE,) 2 MG/1.5ML SOPN, Inject 1 mg into the skin once a week., Disp: 3 pen, Rfl: 2 .  insulin detemir (LEVEMIR) 100 UNIT/ML injection, Inject 20 Units into the skin at bedtime., Disp: , Rfl:  .  metFORMIN (GLUCOPHAGE-XR) 500 MG 24 hr tablet, TAKE 1 TABLET BY MOUTH TWICE A DAY WITH MEALS (Patient not taking: Reported on 10/05/2019), Disp: 180 tablet, Rfl: 0   No Known Allergies   Review of Systems  + for having L posterior shoulder pain off and on since doing wt lifting. No paresthesia. Is working on loosing wt. The rest is neg.  Today's Vitals   10/05/19 0949  BP: 130/84  Pulse: 72  Temp: 98 F (36.7 C)  TempSrc: Oral  Weight: (!) 318 lb (144.2 kg)   Body mass index is 41.05 kg/m.   Objective:  Physical Exam  Wt is down 4 lbs since June. BP 130/84 (BP  Location: Left Arm, Patient Position: Sitting, Cuff Size: Large)   Pulse 72   Temp 98 F (36.7 C) (Oral)   Wt (!) 318 lb (144.2 kg)   BMI 41.05 kg/m  Exam and history done in paper since there was no electricity or wifi.  General Appearance:    Alert, cooperative, no distress, appears stated age  Head:    Normocephalic, without obvious abnormality, atraumatic  Eyes:    PERRL, conjunctiva/corneas clear, EOM's intact, fundi    benign, both eyes       Ears:    Normal TM's and external ear canals, both ears  Nose:   Nares normal, septum midline, mucosa normal, no drainage   or sinus tenderness  Throat:   Lips, mucosa, and tongue normal; teeth and gums normal  Neck:   Supple, symmetrical, trachea midline, no adenopathy;       thyroid:  No enlargement/tenderness/nodules; no carotid   bruit   Back:     Symmetric, no curvature, ROM normal. Has mild tenderness on his L mid trap with palpation.   Lungs:     Clear to auscultation bilaterally, respirations unlabored  Chest wall:  No tenderness or deformity. Has gynecomastia.   Heart:    Regular rate and rhythm, S1 and S2 normal, no murmur, rub   or gallop  Abdomen:     Soft, non-tender, bowel sounds active all four quadrants,    no masses, no organomegaly  Genitalia:    Normal male without lesion, discharge or tenderness  Rectal:    Normal tone, normal prostate, no masses or tenderness;   guaiac negative stool  Extremities:   Extremities normal, atraumatic, no cyanosis or edema  Pulses:   2+ and symmetric all extremities  Skin:   Skin color, texture, turgor normal, no rashes or lesions  Lymph nodes:   Cervical, supraclavicular, and axillary nodes normal  Neurologic:   CNII-XII intact. Normal strength, sensation and reflexes      Throughout. Normal Romberg, tandem gait, nose to finger, tip toe and heel gait.    EKG- NSR UA- + for protein but unknown how much, since resulted only as positive,  and + microalbumin.  Assessment And Plan:     1. Routine medical exam- routine. FU 1 y     Flu shot was given.  2. Type 2 diabetes mellitus with other specified complication, unspecified whether long term insulin use (HCC)- chronic. May continue same med. - Hemoglobin A1c; Future   FU in 3-6 months with PCP 3. Essential hypertension- chronic and stable. May continue current med - CMP14 + Anion Gap - CBC no Diff; Future    FU 3-6 months with PP 4. High cholesterol- chronic. May continue same med - Lipid Profile; Future  5. Obese Class 3 with comorbidity chronic- Is working on wt loss and was encouraged to continue this.  He will come back on Monday for labs.  AVS not printed since we did not have electricity or Wifi during his visit.  6- Microalbuminuria/ Proteinuria- new. We dont have any past UA's in epic to compare with today's.      I will have pt hydrate better when he comes next week for blood work and we can repeat the UA and Microalbumin.  Keidy Thurgood RODRIGUEZ-SOUTHWORTH, PA-C    THE PATIENT IS ENCOURAGED TO PRACTICE SOCIAL DISTANCING DUE TO THE COVID-19 PANDEMIC.

## 2019-10-07 ENCOUNTER — Encounter: Payer: Self-pay | Admitting: Internal Medicine

## 2019-10-07 DIAGNOSIS — R809 Proteinuria, unspecified: Secondary | ICD-10-CM | POA: Insufficient documentation

## 2019-10-07 NOTE — Addendum Note (Signed)
Addended by: Nicki Guadalajara on: 10/07/2019 10:31 AM   Modules accepted: Orders

## 2019-10-09 NOTE — Addendum Note (Signed)
Addended by: Jacqualine Mau on: 10/09/2019 08:45 AM   Modules accepted: Orders

## 2019-10-10 LAB — CMP14 + ANION GAP
ALT: 24 IU/L (ref 0–44)
AST: 17 IU/L (ref 0–40)
Albumin/Globulin Ratio: 1.2 (ref 1.2–2.2)
Albumin: 4.2 g/dL (ref 4.0–5.0)
Alkaline Phosphatase: 46 IU/L (ref 39–117)
Anion Gap: 13 mmol/L (ref 10.0–18.0)
BUN/Creatinine Ratio: 10 (ref 9–20)
BUN: 12 mg/dL (ref 6–24)
Bilirubin Total: 0.8 mg/dL (ref 0.0–1.2)
CO2: 22 mmol/L (ref 20–29)
Calcium: 9.7 mg/dL (ref 8.7–10.2)
Chloride: 102 mmol/L (ref 96–106)
Creatinine, Ser: 1.26 mg/dL (ref 0.76–1.27)
GFR calc Af Amer: 79 mL/min/{1.73_m2} (ref >59)
GFR calc non Af Amer: 68 mL/min/{1.73_m2} (ref >59)
Globulin, Total: 3.6 g/dL (ref 1.5–4.5)
Glucose: 119 mg/dL — ABNORMAL HIGH (ref 65–99)
Potassium: 4 mmol/L (ref 3.5–5.2)
Sodium: 137 mmol/L (ref 134–144)
Total Protein: 7.8 g/dL (ref 6.0–8.5)

## 2019-10-10 LAB — CBC
Hematocrit: 44.4 % (ref 37.5–51.0)
Hemoglobin: 15 g/dL (ref 13.0–17.7)
MCH: 30.4 pg (ref 26.6–33.0)
MCHC: 33.8 g/dL (ref 31.5–35.7)
MCV: 90 fL (ref 79–97)
Platelets: 155 10*3/uL (ref 150–450)
RBC: 4.93 x10E6/uL (ref 4.14–5.80)
RDW: 12 % (ref 11.6–15.4)
WBC: 6.9 10*3/uL (ref 3.4–10.8)

## 2019-10-10 LAB — T3, FREE: T3, Free: 3.6 pg/mL (ref 2.0–4.4)

## 2019-10-10 LAB — LIPID PANEL
Chol/HDL Ratio: 4 ratio (ref 0.0–5.0)
Cholesterol, Total: 171 mg/dL (ref 100–199)
HDL: 43 mg/dL (ref >39)
LDL Chol Calc (NIH): 112 mg/dL — ABNORMAL HIGH (ref 0–99)
Triglycerides: 86 mg/dL (ref 0–149)
VLDL Cholesterol Cal: 16 mg/dL (ref 5–40)

## 2019-10-10 LAB — URINALYSIS, ROUTINE W REFLEX MICROSCOPIC
Bilirubin, UA: NEGATIVE
Glucose, UA: NEGATIVE
Ketones, UA: NEGATIVE
Leukocytes,UA: NEGATIVE
Nitrite, UA: NEGATIVE
Protein,UA: NEGATIVE
RBC, UA: NEGATIVE
Specific Gravity, UA: 1.007 (ref 1.005–1.030)
Urobilinogen, Ur: 0.2 mg/dL (ref 0.2–1.0)
pH, UA: 6 (ref 5.0–7.5)

## 2019-10-10 LAB — T4, FREE: Free T4: 1.29 ng/dL (ref 0.82–1.77)

## 2019-10-10 LAB — HEMOGLOBIN A1C
Est. average glucose Bld gHb Est-mCnc: 157 mg/dL
Hgb A1c MFr Bld: 7.1 % — ABNORMAL HIGH (ref 4.8–5.6)

## 2019-10-10 LAB — MICROALBUMIN / CREATININE URINE RATIO
Creatinine, Urine: 40.1 mg/dL
Microalb/Creat Ratio: <7 mg/g creat (ref 0–29)
Microalbumin, Urine: <3 ug/mL

## 2019-10-10 LAB — PSA: Prostate Specific Ag, Serum: 0.9 ng/mL (ref 0.0–4.0)

## 2019-10-10 LAB — TSH: TSH: 2.65 u[IU]/mL (ref 0.450–4.500)

## 2019-10-15 ENCOUNTER — Encounter: Payer: Self-pay | Admitting: Internal Medicine

## 2019-10-16 ENCOUNTER — Encounter: Payer: Self-pay | Admitting: Internal Medicine

## 2019-11-13 DIAGNOSIS — M7540 Impingement syndrome of unspecified shoulder: Secondary | ICD-10-CM | POA: Insufficient documentation

## 2019-11-13 DIAGNOSIS — M542 Cervicalgia: Secondary | ICD-10-CM | POA: Insufficient documentation

## 2019-11-24 ENCOUNTER — Other Ambulatory Visit: Payer: Self-pay | Admitting: Internal Medicine

## 2019-12-27 ENCOUNTER — Other Ambulatory Visit: Payer: Self-pay | Admitting: Internal Medicine

## 2020-01-11 ENCOUNTER — Ambulatory Visit: Payer: Commercial Managed Care - PPO | Admitting: Internal Medicine

## 2020-01-11 ENCOUNTER — Encounter: Payer: Self-pay | Admitting: Internal Medicine

## 2020-01-15 ENCOUNTER — Other Ambulatory Visit: Payer: Self-pay

## 2020-01-15 ENCOUNTER — Ambulatory Visit: Payer: Commercial Managed Care - PPO | Admitting: Internal Medicine

## 2020-01-15 ENCOUNTER — Encounter: Payer: Self-pay | Admitting: Internal Medicine

## 2020-01-15 VITALS — BP 112/68 | HR 107 | Temp 98.1°F | Ht 73.8 in | Wt 326.2 lb

## 2020-01-15 DIAGNOSIS — E1169 Type 2 diabetes mellitus with other specified complication: Secondary | ICD-10-CM | POA: Diagnosis not present

## 2020-01-15 DIAGNOSIS — I10 Essential (primary) hypertension: Secondary | ICD-10-CM | POA: Diagnosis not present

## 2020-01-15 DIAGNOSIS — Z6841 Body Mass Index (BMI) 40.0 and over, adult: Secondary | ICD-10-CM

## 2020-01-15 MED ORDER — OZEMPIC (1 MG/DOSE) 2 MG/1.5ML ~~LOC~~ SOPN: 1.0000 mg | PEN_INJECTOR | SUBCUTANEOUS | 5 refills | Status: DC

## 2020-01-15 MED ORDER — METFORMIN HCL 500 MG PO TABS: 500.0000 mg | ORAL_TABLET | Freq: Two times a day (BID) | ORAL | 2 refills | Status: DC

## 2020-01-15 MED ORDER — LISINOPRIL 5 MG PO TABS: 5.0000 mg | ORAL_TABLET | Freq: Every day | ORAL | 2 refills | Status: DC

## 2020-01-15 NOTE — Patient Instructions (Signed)

## 2020-01-16 LAB — BMP8+EGFR
BUN/Creatinine Ratio: 11 (ref 9–20)
BUN: 10 mg/dL (ref 6–24)
CO2: 22 mmol/L (ref 20–29)
Calcium: 9 mg/dL (ref 8.7–10.2)
Chloride: 103 mmol/L (ref 96–106)
Creatinine, Ser: 0.95 mg/dL (ref 0.76–1.27)
GFR calc Af Amer: 111 mL/min/{1.73_m2} (ref >59)
GFR calc non Af Amer: 96 mL/min/{1.73_m2} (ref >59)
Glucose: 142 mg/dL — ABNORMAL HIGH (ref 65–99)
Potassium: 3.9 mmol/L (ref 3.5–5.2)
Sodium: 139 mmol/L (ref 134–144)

## 2020-01-16 LAB — HEMOGLOBIN A1C
Est. average glucose Bld gHb Est-mCnc: 163 mg/dL
Hgb A1c MFr Bld: 7.3 % — ABNORMAL HIGH (ref 4.8–5.6)

## 2020-01-16 NOTE — Progress Notes (Signed)
This visit occurred during the SARS-CoV-2 public health emergency.  Safety protocols were in place, including screening questions prior to the visit, additional usage of staff PPE, and extensive cleaning of exam room while observing appropriate contact time as indicated for disinfecting solutions.  Subjective:     Patient ID: Joel Wiley , male    DOB: Jun 03, 1976 , 44 y.o.   MRN: [id number]   Chief Complaint  Patient presents with  . Diabetes  . Hypertension    HPI  He is here today for diabetes check.  He reports his Mom is in hospital after suffering a stroke. He plans to move her in with him once she is discharged.   Diabetes He presents for his follow-up diabetic visit. He has type 2 diabetes mellitus. His disease course has been improving. There are no hypoglycemic associated symptoms. Pertinent negatives for diabetes include no blurred vision and no chest pain. There are no hypoglycemic complications. Risk factors for coronary artery disease include diabetes mellitus, dyslipidemia, hypertension, obesity and male sex. He participates in exercise intermittently. His breakfast blood glucose is taken between 8-9 am. His breakfast blood glucose range is generally 110-130 mg/dl. Eye exam is not current.  Hypertension This is a chronic problem. The current episode started more than 1 year ago. The problem has been gradually improving since onset. The problem is controlled. Pertinent negatives include no blurred vision or chest pain. The current treatment provides moderate improvement.     Past Medical History:  Diagnosis Date  . Diabetes ([place])   . High cholesterol   . Hypertension   . Morbid obesity with BMI of 40.0-44.9, adult (HCC)      Family History  Problem Relation Age of Onset  . Hyperlipidemia Mother   . Hypertension Mother   . Diabetes Mother   . Stroke Mother   . Heart disease Father        heavy smoker  . Hypertension Father   . Hyperlipidemia Father       Current Outpatient Medications:  .  aspirin EC 81 MG tablet, Take 81 mg by mouth daily., Disp: , Rfl:  .  lisinopril (ZESTRIL) 5 MG tablet, Take 1 tablet (5 mg total) by mouth daily., Disp: 90 tablet, Rfl: 2 .  metFORMIN (GLUCOPHAGE) 500 MG tablet, Take 1 tablet (500 mg total) by mouth 2 (two) times daily with a meal., Disp: 180 tablet, Rfl: 2 .  pravastatin (PRAVACHOL) 20 MG tablet, TAKE 1 TABLET BY MOUTH EVERY DAY, Disp: 90 tablet, Rfl: 1 .  Semaglutide, 1 MG/DOSE, (OZEMPIC, 1 MG/DOSE,) 2 MG/1.5ML SOPN, Inject 1 mg into the skin once a week., Disp: 2 pen, Rfl: 5   No Known Allergies   Review of Systems  Constitutional: Negative.   Eyes: Negative for blurred vision.  Respiratory: Negative.   Cardiovascular: Negative.  Negative for chest pain.  Gastrointestinal: Negative.   Neurological: Negative.   Psychiatric/Behavioral: Negative.      Today's Vitals   01/15/20 1536  BP: 112/68  Pulse: (!) 107  Temp: 98.1 F (36.7 C)  TempSrc: Oral  Weight: (!) 326 lb 3.2 oz (148 kg)  Height: 6' 1.8" (1.875 m)   Body mass index is 42.11 kg/m.   Objective:  Physical Exam Vitals and nursing note reviewed.  Constitutional:      Appearance: Normal appearance. He is obese.  Cardiovascular:     Rate and Rhythm: Normal rate and regular rhythm.     Heart sounds: Normal heart sounds.  Pulmonary:  Effort: Pulmonary effort is normal.     Breath sounds: Normal breath sounds.  Skin:    General: Skin is warm.  Neurological:     General: No focal deficit present.     Mental Status: He is alert.  Psychiatric:        Mood and Affect: Mood normal.         Assessment And Plan:     1. Type 2 diabetes mellitus with other specified complication, unspecified whether long term insulin use (HCC)  Chronic, I will check labs as listed below. Importance of dietary, exercise and medication compliance was discussed with the patient.   - BMP8+EGFR - Hemoglobin A1c  2. Essential  hypertension  Chronic, well controlled. He will continue with current meds. He is encouraged to avoid adding salt to his foods.   3. Class 3 severe obesity due to excess calories with serious comorbidity and body mass index (BMI) of 40.0 to 44.9 in adult [facility])  Wt Readings from Last 3 Encounters:  01/15/20 (!) 326 lb 3.2 oz (148 kg)  10/05/19 (!) 318 lb (144.2 kg)  05/30/19 (!) 322 lb (146.1 kg)   He is aware of 8 pound weight gain. He is encouraged to strive for BMI less than 35 to decrease cardiac risk. He is advised to aim for at least 150 minutes of exercise per week.   [name], MD    THE PATIENT IS ENCOURAGED TO PRACTICE SOCIAL DISTANCING DUE TO THE COVID-19 PANDEMIC.

## 2020-02-22 ENCOUNTER — Ambulatory Visit: Payer: Self-pay | Attending: Internal Medicine

## 2020-02-22 DIAGNOSIS — Z23 Encounter for immunization: Secondary | ICD-10-CM

## 2020-02-22 NOTE — Progress Notes (Signed)
   Covid-19 Vaccination Clinic  Name:  Joel Wiley    MRN: [id number] DOB: Sep 14, 1976  02/22/2020  [name] was observed post Covid-19 immunization for 15 minutes without incident. He was provided with Vaccine Information Sheet and instruction to access the V-Safe system.   [name] was instructed to call 911 with any severe reactions post vaccine: Marland Kitchen Difficulty breathing  . Swelling of face and throat  . A fast heartbeat  . A bad rash all over body  . Dizziness and weakness   Immunizations Administered    Name Date Dose VIS Date Route   Pfizer COVID-19 Vaccine 02/22/2020  8:38 AM 0.3 mL 11/17/2019 Intramuscular   Manufacturer: [employer], [employer]   Lot: [id number]   NDC: [address]-6

## 2020-03-09 ENCOUNTER — Other Ambulatory Visit: Payer: Self-pay | Admitting: Internal Medicine

## 2020-03-18 ENCOUNTER — Ambulatory Visit: Payer: Self-pay | Attending: Internal Medicine

## 2020-03-18 DIAGNOSIS — Z23 Encounter for immunization: Secondary | ICD-10-CM

## 2020-03-18 NOTE — Progress Notes (Signed)
   Covid-19 Vaccination Clinic  Name:  Joel Wiley    MRN: [id number] DOB: August 17, 1976  03/18/2020  [name] was observed post Covid-19 immunization for 15 minutes without incident. He was provided with Vaccine Information Sheet and instruction to access the V-Safe system.   [name] was instructed to call 911 with any severe reactions post vaccine: Marland Kitchen Difficulty breathing  . Swelling of face and throat  . A fast heartbeat  . A bad rash all over body  . Dizziness and weakness   Immunizations Administered    Name Date Dose VIS Date Route   Pfizer COVID-19 Vaccine 03/18/2020  8:11 AM 0.3 mL 11/17/2019 Intramuscular   Manufacturer: [employer], [employer]   Lot: [id number]   NDC: [address]-8

## 2020-05-15 ENCOUNTER — Ambulatory Visit: Payer: Commercial Managed Care - PPO | Admitting: Internal Medicine

## 2020-05-15 ENCOUNTER — Encounter: Payer: Self-pay | Admitting: Internal Medicine

## 2020-05-15 ENCOUNTER — Other Ambulatory Visit: Payer: Self-pay

## 2020-05-15 VITALS — BP 120/76 | HR 101 | Temp 98.2°F | Ht 73.8 in | Wt 329.0 lb

## 2020-05-15 DIAGNOSIS — Z636 Dependent relative needing care at home: Secondary | ICD-10-CM

## 2020-05-15 DIAGNOSIS — I10 Essential (primary) hypertension: Secondary | ICD-10-CM

## 2020-05-15 DIAGNOSIS — R000 Tachycardia, unspecified: Secondary | ICD-10-CM | POA: Diagnosis not present

## 2020-05-15 DIAGNOSIS — E7800 Pure hypercholesterolemia, unspecified: Secondary | ICD-10-CM

## 2020-05-15 DIAGNOSIS — E66813 Obesity, class 3: Secondary | ICD-10-CM

## 2020-05-15 DIAGNOSIS — E1169 Type 2 diabetes mellitus with other specified complication: Secondary | ICD-10-CM

## 2020-05-15 DIAGNOSIS — Z6841 Body Mass Index (BMI) 40.0 and over, adult: Secondary | ICD-10-CM

## 2020-05-16 LAB — CMP14+EGFR
ALT: 36 IU/L (ref 0–44)
AST: 24 IU/L (ref 0–40)
Albumin/Globulin Ratio: 1.4 (ref 1.2–2.2)
Albumin: 4.2 g/dL (ref 4.0–5.0)
Alkaline Phosphatase: 43 IU/L — ABNORMAL LOW (ref 48–121)
BUN/Creatinine Ratio: 13 (ref 9–20)
BUN: 13 mg/dL (ref 6–24)
Bilirubin Total: 0.5 mg/dL (ref 0.0–1.2)
CO2: 22 mmol/L (ref 20–29)
Calcium: 9.4 mg/dL (ref 8.7–10.2)
Chloride: 104 mmol/L (ref 96–106)
Creatinine, Ser: 1.02 mg/dL (ref 0.76–1.27)
GFR calc Af Amer: 101 mL/min/{1.73_m2} (ref >59)
GFR calc non Af Amer: 88 mL/min/{1.73_m2} (ref >59)
Globulin, Total: 3.1 g/dL (ref 1.5–4.5)
Glucose: 141 mg/dL — ABNORMAL HIGH (ref 65–99)
Potassium: 4.2 mmol/L (ref 3.5–5.2)
Sodium: 140 mmol/L (ref 134–144)
Total Protein: 7.3 g/dL (ref 6.0–8.5)

## 2020-05-16 LAB — HEPATITIS C ANTIBODY: Hep C Virus Ab: <0.1 s/co ratio (ref 0.0–0.9)

## 2020-05-16 LAB — HEMOGLOBIN A1C
Est. average glucose Bld gHb Est-mCnc: 163 mg/dL
Hgb A1c MFr Bld: 7.3 % — ABNORMAL HIGH (ref 4.8–5.6)

## 2020-05-16 LAB — LIPID PANEL
Chol/HDL Ratio: 3.8 ratio (ref 0.0–5.0)
Cholesterol, Total: 172 mg/dL (ref 100–199)
HDL: 45 mg/dL (ref >39)
LDL Chol Calc (NIH): 103 mg/dL — ABNORMAL HIGH (ref 0–99)
Triglycerides: 137 mg/dL (ref 0–149)
VLDL Cholesterol Cal: 24 mg/dL (ref 5–40)

## 2020-05-17 NOTE — Progress Notes (Signed)
This visit occurred during the SARS-CoV-2 public health emergency.  Safety protocols were in place, including screening questions prior to the visit, additional usage of staff PPE, and extensive cleaning of exam room while observing appropriate contact time as indicated for disinfecting solutions.  Subjective:     Patient ID: Joel Wiley , male    DOB: [birth year] , 44 y.o.   MRN: [id number]   Chief Complaint  Patient presents with  . Diabetes  . Hypertension  . Hyperlipidemia    HPI  He is here today for diabetes/BP and chol check. He reports compliance with meds. Admits he is not getting the exercise he needs due to fatigue. 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 She was previously living at home alone. His father is deceased.   Diabetes He presents for his follow-up diabetic visit. He has type 2 diabetes mellitus. His disease course has been improving. There are no hypoglycemic associated symptoms. Pertinent negatives for diabetes include no blurred vision and no chest pain. There are no hypoglycemic complications. Risk factors for coronary artery disease include diabetes mellitus, dyslipidemia, hypertension, obesity and male sex. He participates in exercise intermittently. His breakfast blood glucose is taken between 8-9 am. His breakfast blood glucose range is generally 110-130 mg/dl. Eye exam is not current.  Hypertension This is a chronic problem. The current episode started more than 1 year ago. The problem has been gradually improving since onset. The problem is controlled. Pertinent negatives include no blurred vision or chest pain. The current treatment provides moderate improvement.  Hyperlipidemia This is a chronic problem. The current episode started more than 1 year ago. Exacerbating diseases include diabetes and obesity. He has no history of hypothyroidism. Pertinent negatives include no chest pain.     Past Medical History:  Diagnosis Date  .  Diabetes ([place])   . High cholesterol   . Hypertension   . Morbid obesity with BMI of 40.0-44.9, adult (HCC)      Family History  Problem Relation Age of Onset  . Hyperlipidemia Mother   . Hypertension Mother   . Diabetes Mother   . Stroke Mother   . Heart disease Father        heavy smoker  . Hypertension Father   . Hyperlipidemia Father      Current Outpatient Medications:  .  aspirin EC 81 MG tablet, Take 81 mg by mouth daily., Disp: , Rfl:  .  lisinopril (ZESTRIL) 5 MG tablet, Take 1 tablet (5 mg total) by mouth daily., Disp: 90 tablet, Rfl: 2 .  metFORMIN (GLUCOPHAGE) 500 MG tablet, Take 1 tablet (500 mg total) by mouth 2 (two) times daily with a meal., Disp: 180 tablet, Rfl: 2 .  pravastatin (PRAVACHOL) 20 MG tablet, TAKE 1 TABLET BY MOUTH EVERY DAY, Disp: 90 tablet, Rfl: 1 .  Semaglutide, 1 MG/DOSE, (OZEMPIC, 1 MG/DOSE,) 2 MG/1.5ML SOPN, Inject 1 mg into the skin once a week., Disp: 2 pen, Rfl: 5   No Known Allergies   Review of Systems  Constitutional: Negative.   Eyes: Negative for blurred vision.  Respiratory: Negative.   Cardiovascular: Negative.  Negative for chest pain.  Gastrointestinal: Negative.   Neurological: Negative.   Psychiatric/Behavioral: Negative.      Today's Vitals   05/15/20 1604  BP: 120/76  Pulse: (!) 101  Temp: 98.2 F (36.8 C)  TempSrc: Oral  Weight: (!) 329 lb (149.2 kg)  Height: 6' 1.8" (1.875 m)   Body mass  index is 42.47 kg/m.   Objective:  Physical Exam Vitals and nursing note reviewed.  Constitutional:      Appearance: Normal appearance. He is obese.  Cardiovascular:     Rate and Rhythm: Regular rhythm. Tachycardia present.     Heart sounds: Normal heart sounds.  Pulmonary:     Effort: Pulmonary effort is normal.     Breath sounds: Normal breath sounds.  Skin:    General: Skin is warm.  Neurological:     General: No focal deficit present.     Mental Status: He is alert.  Psychiatric:        Mood and Affect: Mood  normal.         Assessment And Plan:     1. Type 2 diabetes mellitus with other specified complication, unspecified whether long term insulin use (HCC)  Chronic, I will check labs as listed below. I will make med/dosage adjustments as needed. I will also check HCV Ab, he was given reasoning on why this is important.   - CMP14+EGFR - Hemoglobin A1c - Lipid panel - Hepatitis C antibody  2. Essential hypertension  Chronic, well controlled. He will continue with current meds. He is encouraged to avoid adding salt to his foods.   3. Pure hypercholesterolemia  Chronic. I will check non-fasting lipid panel and LFTs. He is encouraged to avoid fried foods and to incorporate more exercise into his daily routine.   4. Tachycardia with hypertension  He is encouraged to decrease his caffeine intake, increase water intake and to rest and de-stress as much as possible. He is encouraged to go on a date night with his wife.   5. Class 3 severe obesity due to excess calories with serious comorbidity and body mass index (BMI) of 40.0 to 44.9 in adult (HCC)  BMI 42, he is encouraged to strive for BMI less than 35 to decrease cardiac risk. Importance of regular exercise was discussed with the patient.   6. Caregiver stress He may benefit from support groups. Will discuss further at next visit.     [name], MD    THE PATIENT IS ENCOURAGED TO PRACTICE SOCIAL DISTANCING DUE TO THE COVID-19 PANDEMIC.

## 2020-05-17 NOTE — Patient Instructions (Signed)

## 2020-05-19 ENCOUNTER — Encounter: Payer: Self-pay | Admitting: Internal Medicine

## 2020-08-15 ENCOUNTER — Ambulatory Visit: Payer: Commercial Managed Care - PPO | Admitting: Internal Medicine

## 2020-08-22 ENCOUNTER — Ambulatory Visit: Payer: Commercial Managed Care - PPO | Admitting: Internal Medicine

## 2020-09-03 ENCOUNTER — Other Ambulatory Visit: Payer: Self-pay

## 2020-09-03 ENCOUNTER — Encounter: Payer: Self-pay | Admitting: Internal Medicine

## 2020-09-03 ENCOUNTER — Ambulatory Visit (INDEPENDENT_AMBULATORY_CARE_PROVIDER_SITE_OTHER): Payer: Commercial Managed Care - PPO | Admitting: Internal Medicine

## 2020-09-03 VITALS — BP 124/82 | HR 88 | Temp 97.9°F | Ht 73.0 in | Wt 323.4 lb

## 2020-09-03 DIAGNOSIS — Z6841 Body Mass Index (BMI) 40.0 and over, adult: Secondary | ICD-10-CM | POA: Diagnosis not present

## 2020-09-03 DIAGNOSIS — E1169 Type 2 diabetes mellitus with other specified complication: Secondary | ICD-10-CM

## 2020-09-03 DIAGNOSIS — I10 Essential (primary) hypertension: Secondary | ICD-10-CM

## 2020-09-03 NOTE — Progress Notes (Signed)
I,[name],acting as a Education administrator for [name], MD.,have documented all relevant documentation on the behalf of [name], MD,as directed by  [name], MD while in the presence of [name], MD.  This visit occurred during the SARS-CoV-2 public health emergency.  Safety protocols were in place, including screening questions prior to the visit, additional usage of staff PPE, and extensive cleaning of exam room while observing appropriate contact time as indicated for disinfecting solutions.  Subjective:     Patient ID: Joel Wiley , male    DOB: Nov 09, 1976 , 44 y.o.   MRN: [id number]   Chief Complaint  Patient presents with  . Diabetes  . Hypertension    HPI  He is here today for diabetes and blood pressure follow-up.  He reports compliance with meds and he has recently made some dietary changes.   Diabetes He presents for his follow-up diabetic visit. He has type 2 diabetes mellitus. His disease course has been improving. There are no hypoglycemic associated symptoms. Pertinent negatives for diabetes include no blurred vision. There are no hypoglycemic complications. Risk factors for coronary artery disease include diabetes mellitus, dyslipidemia, hypertension, obesity and male sex. He participates in exercise intermittently. His breakfast blood glucose is taken between 8-9 am. His breakfast blood glucose range is generally 110-130 mg/dl. Eye exam is not current.  Hypertension This is a chronic problem. The current episode started more than 1 year ago. The problem has been gradually improving since onset. The problem is controlled. Pertinent negatives include no blurred vision. The current treatment provides moderate improvement.     Past Medical History:  Diagnosis Date  . Diabetes ([place])   . High cholesterol   . Hypertension   . Morbid obesity with BMI of 40.0-44.9, adult (HCC)      Family History  Problem Relation Age of Onset  . Hyperlipidemia  Mother   . Hypertension Mother   . Diabetes Mother   . Stroke Mother   . Heart disease Father        heavy smoker  . Hypertension Father   . Hyperlipidemia Father      Current Outpatient Medications:  .  aspirin EC 81 MG tablet, Take 81 mg by mouth daily., Disp: , Rfl:  .  lisinopril (ZESTRIL) 5 MG tablet, Take 1 tablet (5 mg total) by mouth daily., Disp: 90 tablet, Rfl: 2 .  metFORMIN (GLUCOPHAGE) 500 MG tablet, Take 1 tablet (500 mg total) by mouth 2 (two) times daily with a meal., Disp: 180 tablet, Rfl: 2 .  Semaglutide, 1 MG/DOSE, (OZEMPIC, 1 MG/DOSE,) 2 MG/1.5ML SOPN, Inject 1 mg into the skin once a week., Disp: 2 pen, Rfl: 5 .  pravastatin (PRAVACHOL) 20 MG tablet, TAKE 1 TABLET BY MOUTH EVERY DAY, Disp: 90 tablet, Rfl: 1   No Known Allergies   Review of Systems  Constitutional: Negative.   Eyes: Negative for blurred vision.  Respiratory: Negative.   Cardiovascular: Negative.   Gastrointestinal: Negative.   Psychiatric/Behavioral: Negative.   All other systems reviewed and are negative.    Today's Vitals   09/03/20 1531  BP: 124/82  Pulse: 88  Temp: 97.9 F (36.6 C)  TempSrc: Oral  Weight: (!) 323 lb 6.4 oz (146.7 kg)  Height: 6' 1"  (1.854 m)   Body mass index is 42.67 kg/m.   Objective:  Physical Exam Vitals and nursing note reviewed.  Constitutional:      Appearance: Normal appearance. He is obese.  HENT:  Head: Normocephalic and atraumatic.  Cardiovascular:     Rate and Rhythm: Normal rate and regular rhythm.     Heart sounds: Normal heart sounds.  Pulmonary:     Breath sounds: Normal breath sounds.  Skin:    General: Skin is warm.  Neurological:     General: No focal deficit present.     Mental Status: He is alert and oriented to person, place, and time.         Assessment And Plan:     1. Type 2 diabetes mellitus with other specified complication, unspecified whether long term insulin use (HCC) Comments: Chronic. I will check labs as  listed below. Encouraged to incorporate 150 minutes of exercise per week. I will refer him for diabetic eye exam.  - Ambulatory referral to Ophthalmology - BMP8+EGFR - Hemoglobin A1c  2. Essential hypertension Comments: Well controlled. He will continue with current meds.   3. Class 3 severe obesity due to excess calories with serious comorbidity and body mass index (BMI) of 40.0 to 44.9 in adult [facility]) Comments: BMI 42.  He was congratulated on his six pound weight loss and encouraged to keep up the great work!      Patient was given opportunity to ask questions. Patient verbalized understanding of the plan and was able to repeat key elements of the plan. All questions were answered to their satisfaction.  [name], MD   I, [name], MD, have reviewed all documentation for this visit. The documentation on 09/04/20 for the exam, diagnosis, procedures, and orders are all accurate and complete.  THE PATIENT IS ENCOURAGED TO PRACTICE SOCIAL DISTANCING DUE TO THE COVID-19 PANDEMIC.

## 2020-09-03 NOTE — Patient Instructions (Signed)
Diabetes Mellitus and Sick Day Management Blood sugar (glucose) can be difficult to control when you are sick. Common illnesses that can cause problems for people with diabetes (diabetes mellitus) include colds, fever, flu (influenza), nausea, vomiting, and diarrhea. These illnesses can cause stress and loss of body fluids (dehydration), and those issues can cause blood glucose levels to increase. Because of this, it is very important to take your insulin and diabetes medicines and eat some form of carbohydrate when you are sick. You should make a plan for days when you are sick (sick day plan) as part of your diabetes management plan. You and your health care provider should make this plan in advance. The following guidelines are intended to help you manage an illness that lasts for about 24 hours or less. Your health care provider may also give you more specific instructions. What do I need to do to manage my blood glucose?   Check your blood glucose every 2-4 hours, or as often as told by your health care provider.  Know your sick day treatment goals. Your target blood glucose levels may be different when you are sick.  If you use insulin, take your usual dose. ? If your blood glucose continues to be too high, you may need to take an additional insulin dose as told by your health care provider.  If you use oral diabetes medicine, you may need to stop taking it if you are not able to eat or drink normally. Ask your health care provider about whether you need to stop taking these medicines while you are sick.  If you use injectable hormone medicines other than insulin to control your diabetes, ask your health care provider about whether you need to stop taking these medicines while you are sick. What else can I do to manage my diabetes when I am sick? Check your ketones  If you have type 1 diabetes, check your urine ketones every 4 hours.  If you have type 2 diabetes, check your urine ketones  as often as told by your health care provider. Drink fluids  Drink enough fluid to keep your urine clear or pale yellow. This is especially important if you have a fever, vomiting, or diarrhea. Those symptoms can lead to dehydration.  Follow any instructions from your health care provider about beverages to avoid. ? Do not drink alcohol, caffeine, or drinks that contain a lot of sugar. Take medicines as directed  Take-over-the-counter and prescription medicines only as told by your health care provider.  Check medicine labels for added sugars. Some medicines may contain sugar or types of sugars that can raise your blood glucose level. What foods can I eat when I am sick?  You need to eat some form of carbohydrates when you are sick. You should eat 45-50 grams (45-50 g) of carbohydrates every 3-4 hours until you feel better. All of the food choices below contain about 15 g of carbohydrates. Plan ahead and keep some of these foods around so you have them if you get sick.  4-6 oz (120-177 mL) carbonated beverage that contains sugar, such as regular (not diet) soda. You may be able to drink carbonated beverages more easily if you open the beverage and let it sit at room temperature for a few minutes before drinking.   of a twin frozen ice pop.  4 oz (120 g) regular gelatin.  4 oz (120 mL) fruit juice.  4 oz (120 g) ice cream or frozen yogurt.  2   oz (60 g) sherbet.  8 oz (240 mL) clear broth or soup.  4 oz (120 g) regular custard.  4 oz (120 g) regular pudding.  8 oz (240 g) plain yogurt.  1 slice bread or toast.  6 saltine crackers.  5 vanilla wafers. Questions to ask your health care provider Consider asking the following questions so you know what to do on days when you are sick:  Should I adjust my diabetes medicines?  How often do I need to check my blood glucose?  What supplies do I need to manage my diabetes at home when I am sick?  What number can I call if I  have questions?  What foods and drinks should I avoid? Contact a health care provider if:  You develop symptoms of diabetic ketoacidosis, such as: ? Fatigue. ? Weight loss. ? Excessive thirst. ? Light-headedness. ? Fruity or sweet-smelling breath. ? Excessive urination. ? Vision changes. ? Confusion or irritability. ? Nausea. ? Vomiting. ? Rapid breathing. ? Pain in the abdomen. ? Feeling flushed.  You are unable to drink fluids without vomiting.  You have any of the following for more than 6 hours: ? Nausea. ? Vomiting. ? Diarrhea.  Your blood glucose is at or above 240 mg/dL (13.3 mmol/L), even after you take an additional insulin dose.  You have a change in how you think, feel, or act (mental status).  You develop another serious illness.  You have been sick or have had a fever for 2 days or longer and you are not getting better. Get help right away if:  Your blood glucose is lower than 54 mg/dL (3.0 mmol/L).  You have difficulty breathing.  You have moderate or high ketone levels in your urine.  You used emergency glucagon to treat low blood glucose. Summary  Blood sugar (glucose) can be difficult to control when you are sick. Common illnesses that can cause problems for people with diabetes (diabetes mellitus) include colds, fever, flu (influenza), nausea, vomiting, and diarrhea.  Illnesses can cause stress and loss of body fluids (dehydration), and those issues can cause blood glucose levels to increase.  Make a plan for days when you are sick (sick day plan) as part of your diabetes management plan. You and your health care provider should make this plan in advance.  It is very important to take your insulin and diabetes medicines and to eat some form of carbohydrate when you are sick.  Contact your health care provider if have problems managing your blood glucose levels when you are sick, or if you have been sick or had a fever for 2 days or longer and are  not getting better. This information is not intended to replace advice given to you by your health care provider. Make sure you discuss any questions you have with your health care provider. Document Revised: 08/21/2016 Document Reviewed: 08/21/2016 Elsevier Patient Education  2020 [employer]vier Inc.  

## 2020-09-04 ENCOUNTER — Other Ambulatory Visit: Payer: Self-pay | Admitting: Internal Medicine

## 2020-09-04 LAB — BMP8+EGFR
BUN/Creatinine Ratio: 11 (ref 9–20)
BUN: 14 mg/dL (ref 6–24)
CO2: 24 mmol/L (ref 20–29)
Calcium: 9.4 mg/dL (ref 8.7–10.2)
Chloride: 103 mmol/L (ref 96–106)
Creatinine, Ser: 1.24 mg/dL (ref 0.76–1.27)
GFR calc Af Amer: 80 mL/min/{1.73_m2} (ref >59)
GFR calc non Af Amer: 69 mL/min/{1.73_m2} (ref >59)
Glucose: 122 mg/dL — ABNORMAL HIGH (ref 65–99)
Potassium: 4.3 mmol/L (ref 3.5–5.2)
Sodium: 140 mmol/L (ref 134–144)

## 2020-09-04 LAB — HEMOGLOBIN A1C
Est. average glucose Bld gHb Est-mCnc: 151 mg/dL
Hgb A1c MFr Bld: 6.9 % — ABNORMAL HIGH (ref 4.8–5.6)

## 2020-10-17 ENCOUNTER — Other Ambulatory Visit: Payer: Self-pay | Admitting: Internal Medicine

## 2020-10-17 ENCOUNTER — Encounter: Payer: Commercial Managed Care - PPO | Admitting: Internal Medicine

## 2020-11-05 LAB — HM DIABETES EYE EXAM

## 2020-11-06 ENCOUNTER — Encounter: Payer: Self-pay | Admitting: Internal Medicine

## 2020-11-11 ENCOUNTER — Other Ambulatory Visit: Payer: Self-pay

## 2020-11-11 ENCOUNTER — Ambulatory Visit (INDEPENDENT_AMBULATORY_CARE_PROVIDER_SITE_OTHER): Payer: Commercial Managed Care - PPO | Admitting: Internal Medicine

## 2020-11-11 ENCOUNTER — Encounter: Payer: Self-pay | Admitting: Internal Medicine

## 2020-11-11 VITALS — BP 130/74 | HR 80 | Temp 98.1°F | Ht 72.2 in | Wt 329.0 lb

## 2020-11-11 DIAGNOSIS — Z23 Encounter for immunization: Secondary | ICD-10-CM | POA: Diagnosis not present

## 2020-11-11 DIAGNOSIS — I10 Essential (primary) hypertension: Secondary | ICD-10-CM

## 2020-11-11 DIAGNOSIS — Z0000 Encounter for general adult medical examination without abnormal findings: Secondary | ICD-10-CM

## 2020-11-11 DIAGNOSIS — E1165 Type 2 diabetes mellitus with hyperglycemia: Secondary | ICD-10-CM

## 2020-11-11 DIAGNOSIS — E1169 Type 2 diabetes mellitus with other specified complication: Secondary | ICD-10-CM

## 2020-11-11 DIAGNOSIS — Z6841 Body Mass Index (BMI) 40.0 and over, adult: Secondary | ICD-10-CM

## 2020-11-11 LAB — POCT URINALYSIS DIPSTICK
Bilirubin, UA: NEGATIVE
Blood, UA: NEGATIVE
Glucose, UA: NEGATIVE
Ketones, UA: NEGATIVE
Leukocytes, UA: NEGATIVE
Nitrite, UA: NEGATIVE
Protein, UA: NEGATIVE
Spec Grav, UA: 1.025 (ref 1.010–1.025)
Urobilinogen, UA: 4 E.U./dL — AB
pH, UA: 7 (ref 5.0–8.0)

## 2020-11-11 LAB — POCT UA - MICROALBUMIN
Albumin/Creatinine Ratio, Urine, POC: 30
Creatinine, POC: 100 mg/dL
Microalbumin Ur, POC: 10 mg/L

## 2020-11-11 NOTE — Progress Notes (Signed)
This visit occurred during the SARS-CoV-2 public health emergency.  Safety protocols were in place, including screening questions prior to the visit, additional usage of staff PPE, and extensive cleaning of exam room while observing appropriate contact time as indicated for disinfecting solutions.  Subjective:     Patient ID: Joel Wiley , male    DOB: Jul 16, 1976 , 44 y.o.   MRN: [id number]   Chief Complaint  Patient presents with  . Annual Exam  . Diabetes  . Hypertension    HPI  Patient here for physical exam. He denies having any specific concerns at this time.  Diabetes He presents for his follow-up diabetic visit. He has type 2 diabetes mellitus. His disease course has been improving. Pertinent negatives for diabetes include no blurred vision. There are no hypoglycemic complications. Risk factors for coronary artery disease include diabetes mellitus, dyslipidemia, hypertension, obesity and male sex. He participates in exercise intermittently. His breakfast blood glucose is taken between 8-9 am. His breakfast blood glucose range is generally 110-130 mg/dl. Eye exam is not current.  Hypertension This is a chronic problem. The current episode started more than 1 year ago. The problem has been gradually improving since onset. The problem is controlled. Pertinent negatives include no blurred vision. The current treatment provides moderate improvement.     Past Medical History:  Diagnosis Date  . Diabetes ([place])   . High cholesterol   . Hypertension   . Morbid obesity with BMI of 40.0-44.9, adult ([facility])      Family History  Problem Relation Age of Onset  . Hyperlipidemia Mother   . Hypertension Mother   . Diabetes Mother   . Stroke Mother   . Heart disease Father        heavy smoker  . Hypertension Father   . Hyperlipidemia Father      Current Outpatient Medications:  .  aspirin EC 81 MG tablet, Take 81 mg by mouth daily., Disp: , Rfl:  .  lisinopril (ZESTRIL) 5 MG  tablet, Take 1 tablet (5 mg total) by mouth daily., Disp: 90 tablet, Rfl: 2 .  metFORMIN (GLUCOPHAGE) 500 MG tablet, TAKE 1 TABLET (500 MG TOTAL) BY MOUTH 2 (TWO) TIMES DAILY WITH A MEAL., Disp: 180 tablet, Rfl: 2 .  Semaglutide, 1 MG/DOSE, (OZEMPIC, 1 MG/DOSE,) 2 MG/1.5ML SOPN, Inject 1 mg into the skin once a week., Disp: 2 pen, Rfl: 5 .  pravastatin (PRAVACHOL) 80 MG tablet, Take 1 tablet (80 mg total) by mouth daily., Disp: 90 tablet, Rfl: 1   No Known Allergies   Men's preventive visit. Patient Health Questionnaire (PHQ-2) is  [place] Office Visit from 09/03/2020 in [facility]  PHQ-2 Total Score 0    . Patient is on a healthy diet. Marital status: Married. Relevant history for alcohol use is:  Social History   Substance and Sexual Activity  Alcohol Use Yes   Comment: occasionally   . Relevant history for tobacco use is:  Social History   Tobacco Use  Smoking Status Never Smoker  Smokeless Tobacco Never Used  .   Review of Systems  Constitutional: Negative.   HENT: Negative.   Eyes: Negative.  Negative for blurred vision.  Respiratory: Negative.   Cardiovascular: Negative.   Gastrointestinal: Negative.   Endocrine: Negative.   Genitourinary: Negative.   Musculoskeletal: Negative.   Skin: Negative.   Allergic/Immunologic: Negative.   Neurological: Negative.   Hematological: Negative.   Psychiatric/Behavioral: Negative.      Today's Vitals  11/11/20 1554  BP: 130/74  Pulse: 80  Temp: 98.1 F (36.7 C)  TempSrc: Oral  Weight: (!) 329 lb (149.2 kg)  Height: 6' 0.2" (1.834 m)   Body mass index is 44.37 kg/m.  Wt Readings from Last 3 Encounters:  11/11/20 (!) 329 lb (149.2 kg)  09/03/20 (!) 323 lb 6.4 oz (146.7 kg)  05/15/20 (!) 329 lb (149.2 kg)     Objective:  Physical Exam Vitals and nursing note reviewed.  Constitutional:      Appearance: Normal appearance. He is obese.  HENT:     Head: Normocephalic and atraumatic.      Right Ear: Tympanic membrane, ear canal and external ear normal.     Left Ear: Tympanic membrane, ear canal and external ear normal.     Nose:     Comments: Deferred, masked    Mouth/Throat:     Comments: Deferred, masked Eyes:     Extraocular Movements: Extraocular movements intact.     Conjunctiva/sclera: Conjunctivae normal.     Pupils: Pupils are equal, round, and reactive to light.  Cardiovascular:     Rate and Rhythm: Normal rate and regular rhythm.     Pulses: Normal pulses.          Dorsalis pedis pulses are 2+ on the right side and 2+ on the left side.     Heart sounds: Normal heart sounds.  Pulmonary:     Effort: Pulmonary effort is normal.     Breath sounds: Normal breath sounds.  Chest:  Breasts:     Right: Normal. No swelling, bleeding, inverted nipple, mass or nipple discharge.     Left: Normal. No swelling, bleeding, inverted nipple, mass or nipple discharge.    Abdominal:     General: Bowel sounds are normal.     Palpations: Abdomen is soft.     Comments: Obese, soft  Musculoskeletal:        General: Normal range of motion.     Cervical back: Normal range of motion and neck supple.  Feet:     Right foot:     Protective Sensation: 5 sites tested. 5 sites sensed.     Skin integrity: Callus and dry skin present.     Toenail Condition: Right toenails are normal.     Left foot:     Protective Sensation: 5 sites tested. 5 sites sensed.     Skin integrity: Dry skin present.     Toenail Condition: Left toenails are normal.  Skin:    General: Skin is warm.  Neurological:     General: No focal deficit present.     Mental Status: He is alert.  Psychiatric:        Mood and Affect: Mood normal.        Behavior: Behavior normal.         Assessment And Plan:    1. Encounter for general adult medical examination w/o abnormal findings Comments: A full exam was performed. PATIENT IS ADVISED TO GET 30-45 MINUTES REGULAR EXERCISE NO LESS THAN FOUR TO FIVE DAYS PER  WEEK - BOTH WEIGHTBEARING EXERCISES AND AEROBIC ARE RECOMMENDED.  PATIENT IS ADVISED TO FOLLOW A HEALTHY DIET WITH AT LEAST SIX FRUITS/VEGGIES PER DAY, DECREASE INTAKE OF RED MEAT, AND TO INCREASE FISH INTAKE TO TWO DAYS PER WEEK.  MEATS/FISH SHOULD NOT BE FRIED, BAKED OR BROILED IS PREFERABLE.  I SUGGEST WEARING SPF 50 SUNSCREEN ON EXPOSED PARTS AND ESPECIALLY WHEN IN THE DIRECT SUNLIGHT FOR AN EXTENDED PERIOD  OF TIME.  PLEASE AVOID FAST FOOD RESTAURANTS AND INCREASE YOUR WATER INTAKE. - CMP14+EGFR - CBC no Diff - Hemoglobin A1c - PSA - Lipid panel  2. Uncontrolled type 2 diabetes mellitus with hyperglycemia ([place]) Comments: Diabetic foot exam was performed. I DISCUSSED WITH THE PATIENT AT LENGTH REGARDING THE GOALS OF GLYCEMIC CONTROL AND POSSIBLE LONG-TERM COMPLICATIONS.  I  ALSO STRESSED THE IMPORTANCE OF COMPLIANCE WITH HOME GLUCOSE MONITORING, DIETARY RESTRICTIONS INCLUDING AVOIDANCE OF SUGARY DRINKS/PROCESSED FOODS,  ALONG WITH REGULAR EXERCISE.  I  ALSO STRESSED THE IMPORTANCE OF ANNUAL EYE EXAMS, SELF FOOT CARE AND COMPLIANCE WITH OFFICE VISITS. - POCT Urinalysis Dipstick (81002) - POCT UA - Microalbumin  3. Essential hypertension Comments: Chronic, controlled. He will continue with current meds. He is encouraged to avoid adding salt to his foods. EKG performed, NSR w/o acute changes. - EKG 12-Lead  4. Class 3 severe obesity due to excess calories with serious comorbidity and body mass index (BMI) of 40.0 to 44.9 in adult [facility]) Comments: BMI 44. He has He is encouraged to initially strive for BMI less than 39 to decrease cardiac risk. Advised to aim for at least 150 minutes of exercise per week.  5. Immunization due Comments: He was given Pneumovax-23 to update his immunization history.   Patient was given opportunity to ask questions. Patient verbalized understanding of the plan and was able to repeat key elements of the plan. All questions were answered to their satisfaction.  [name], MD   I, [name], MD, have reviewed all documentation for this visit. The documentation on 12/01/20 for the exam, diagnosis, procedures, and orders are all accurate and complete.  THE PATIENT IS ENCOURAGED TO PRACTICE SOCIAL DISTANCING DUE TO THE COVID-19 PANDEMIC.

## 2020-11-11 NOTE — Patient Instructions (Signed)

## 2020-11-12 LAB — LIPID PANEL
Chol/HDL Ratio: 4.8 ratio (ref 0.0–5.0)
Cholesterol, Total: 220 mg/dL — ABNORMAL HIGH (ref 100–199)
HDL: 46 mg/dL (ref >39)
LDL Chol Calc (NIH): 152 mg/dL — ABNORMAL HIGH (ref 0–99)
Triglycerides: 122 mg/dL (ref 0–149)
VLDL Cholesterol Cal: 22 mg/dL (ref 5–40)

## 2020-11-12 LAB — CMP14+EGFR
ALT: 31 IU/L (ref 0–44)
AST: 25 IU/L (ref 0–40)
Albumin/Globulin Ratio: 1.2 (ref 1.2–2.2)
Albumin: 4.2 g/dL (ref 4.0–5.0)
Alkaline Phosphatase: 43 IU/L — ABNORMAL LOW (ref 44–121)
BUN/Creatinine Ratio: 10 (ref 9–20)
BUN: 11 mg/dL (ref 6–24)
Bilirubin Total: 0.8 mg/dL (ref 0.0–1.2)
CO2: 21 mmol/L (ref 20–29)
Calcium: 9.5 mg/dL (ref 8.7–10.2)
Chloride: 102 mmol/L (ref 96–106)
Creatinine, Ser: 1.11 mg/dL (ref 0.76–1.27)
GFR calc Af Amer: 92 mL/min/{1.73_m2} (ref >59)
GFR calc non Af Amer: 79 mL/min/{1.73_m2} (ref >59)
Globulin, Total: 3.4 g/dL (ref 1.5–4.5)
Glucose: 103 mg/dL — ABNORMAL HIGH (ref 65–99)
Potassium: 4.1 mmol/L (ref 3.5–5.2)
Sodium: 138 mmol/L (ref 134–144)
Total Protein: 7.6 g/dL (ref 6.0–8.5)

## 2020-11-12 LAB — CBC
Hematocrit: 44.3 % (ref 37.5–51.0)
Hemoglobin: 15 g/dL (ref 13.0–17.7)
MCH: 31.4 pg (ref 26.6–33.0)
MCHC: 33.9 g/dL (ref 31.5–35.7)
MCV: 93 fL (ref 79–97)
Platelets: 153 10*3/uL (ref 150–450)
RBC: 4.78 x10E6/uL (ref 4.14–5.80)
RDW: 11.9 % (ref 11.6–15.4)
WBC: 8.4 10*3/uL (ref 3.4–10.8)

## 2020-11-12 LAB — HEMOGLOBIN A1C
Est. average glucose Bld gHb Est-mCnc: 163 mg/dL
Hgb A1c MFr Bld: 7.3 % — ABNORMAL HIGH (ref 4.8–5.6)

## 2020-11-12 LAB — PSA: Prostate Specific Ag, Serum: 0.9 ng/mL (ref 0.0–4.0)

## 2020-11-19 ENCOUNTER — Other Ambulatory Visit: Payer: Self-pay

## 2020-11-19 MED ORDER — PRAVASTATIN SODIUM 80 MG PO TABS: 80.0000 mg | ORAL_TABLET | Freq: Every day | ORAL | 1 refills | Status: DC

## 2020-12-22 ENCOUNTER — Other Ambulatory Visit: Payer: Self-pay | Admitting: Internal Medicine

## 2020-12-31 ENCOUNTER — Encounter: Payer: Self-pay | Admitting: Internal Medicine

## 2020-12-31 ENCOUNTER — Other Ambulatory Visit: Payer: Self-pay

## 2020-12-31 ENCOUNTER — Ambulatory Visit (INDEPENDENT_AMBULATORY_CARE_PROVIDER_SITE_OTHER): Payer: Commercial Managed Care - PPO | Admitting: Internal Medicine

## 2020-12-31 VITALS — BP 116/74 | HR 98 | Temp 98.2°F | Ht 72.2 in | Wt 315.2 lb

## 2020-12-31 DIAGNOSIS — E7800 Pure hypercholesterolemia, unspecified: Secondary | ICD-10-CM

## 2020-12-31 DIAGNOSIS — Z79899 Other long term (current) drug therapy: Secondary | ICD-10-CM

## 2020-12-31 DIAGNOSIS — Z6841 Body Mass Index (BMI) 40.0 and over, adult: Secondary | ICD-10-CM

## 2020-12-31 NOTE — Patient Instructions (Signed)
High Cholesterol  High cholesterol is a condition in which the blood has high levels of a white, waxy substance similar to fat (cholesterol). The liver makes all the cholesterol that the body needs. The human body needs small amounts of cholesterol to help build cells. A person gets extra or excess cholesterol from the food that he or she eats. The blood carries cholesterol from the liver to the rest of the body. If you have high cholesterol, deposits (plaques) may build up on the walls of your arteries. Arteries are the blood vessels that carry blood away from your heart. These plaques make the arteries narrow and stiff. Cholesterol plaques increase your risk for heart attack and stroke. Work with your health care provider to keep your cholesterol levels in a healthy range. What increases the risk? The following factors may make you more likely to develop this condition:  Eating foods that are high in animal fat (saturated fat) or cholesterol.  Being overweight.  Not getting enough exercise.  A family history of high cholesterol (familial hypercholesterolemia).  Use of tobacco products.  Having diabetes. What are the signs or symptoms? There are no symptoms of this condition. How is this diagnosed? This condition may be diagnosed based on the results of a blood test.  If you are older than 47 years of age, your health care provider may check your cholesterol levels every 4-6 years.  You may be checked more often if you have high cholesterol or other risk factors for heart disease. The blood test for cholesterol measures:  "Bad" cholesterol, or LDL cholesterol. This is the main type of cholesterol that causes heart disease. The desired level is less than 100 mg/dL.  "Good" cholesterol, or HDL cholesterol. HDL helps protect against heart disease by cleaning the arteries and carrying the LDL to the liver for processing. The desired level for HDL is 60 mg/dL or higher.  Triglycerides.  These are fats that your body can store or burn for energy. The desired level is less than 150 mg/dL.  Total cholesterol. This measures the total amount of cholesterol in your blood and includes LDL, HDL, and triglycerides. The desired level is less than 200 mg/dL. How is this treated? This condition may be treated with:  Diet changes. You may be asked to eat foods that have more fiber and less saturated fats or added sugar.  Lifestyle changes. These may include regular exercise, maintaining a healthy weight, and quitting use of tobacco products.  Medicines. These are given when diet and lifestyle changes have not worked. You may be prescribed a statin medicine to help lower your cholesterol levels. Follow these instructions at home: Eating and drinking  Eat a healthy, balanced diet. This diet includes: ? Daily servings of a variety of fresh, frozen, or canned fruits and vegetables. ? Daily servings of whole grain foods that are rich in fiber. ? Foods that are low in saturated fats and trans fats. These include poultry and fish without skin, lean cuts of meat, and low-fat dairy products. ? A variety of fish, especially oily fish that contain omega-3 fatty acids. Aim to eat fish at least 2 times a week.  Avoid foods and drinks that have added sugar.  Use healthy cooking methods, such as roasting, grilling, broiling, baking, poaching, steaming, and stir-frying. Do not fry your food except for stir-frying.   Lifestyle  Get regular exercise. Aim to exercise for a total of 150 minutes a week. Increase your activity level by doing activities   such as gardening, walking, and taking the stairs.  Do not use any products that contain nicotine or tobacco, such as cigarettes, e-cigarettes, and chewing tobacco. If you need help quitting, ask your health care provider.   General instructions  Take over-the-counter and prescription medicines only as told by your health care provider.  Keep all  follow-up visits as told by your health care provider. This is important. Where to find more information  American Heart Association: www.heart.org  National Heart, Lung, and Blood Institute: www.nhlbi.nih.gov Contact a health care provider if:  You have trouble achieving or maintaining a healthy diet or weight.  You are starting an exercise program.  You are unable to stop smoking. Get help right away if:  You have chest pain.  You have trouble breathing.  You have any symptoms of a stroke. "BE FAST" is an easy way to remember the main warning signs of a stroke: ? B - Balance. Signs are dizziness, sudden trouble walking, or loss of balance. ? E - Eyes. Signs are trouble seeing or a sudden change in vision. ? F - Face. Signs are sudden weakness or numbness of the face, or the face or eyelid drooping on one side. ? A - Arms. Signs are weakness or numbness in an arm. This happens suddenly and usually on one side of the body. ? S - Speech. Signs are sudden trouble speaking, slurred speech, or trouble understanding what people say. ? T - Time. Time to call emergency services. Write down what time symptoms started.  You have other signs of a stroke, such as: ? A sudden, severe headache with no known cause. ? Nausea or vomiting. ? Seizure. These symptoms may represent a serious problem that is an emergency. Do not wait to see if the symptoms will go away. Get medical help right away. Call your local emergency services (911 in the [place] Do not drive yourself to the hospital. Summary  Cholesterol plaques increase your risk for heart attack and stroke. Work with your health care provider to keep your cholesterol levels in a healthy range.  Eat a healthy, balanced diet, get regular exercise, and maintain a healthy weight.  Do not use any products that contain nicotine or tobacco, such as cigarettes, e-cigarettes, and chewing tobacco.  Get help right away if you have any symptoms of a  stroke. This information is not intended to replace advice given to you by your health care provider. Make sure you discuss any questions you have with your health care provider. Document Revised: 10/23/2019 Document Reviewed: 10/23/2019 Elsevier Patient Education  2021 [employer] Inc.  

## 2020-12-31 NOTE — Progress Notes (Signed)
I,[name],acting as a Neurosurgeon for [name], MD.,have documented all relevant documentation on the behalf of [name], MD,as directed by  [name], MD while in the presence of [name], MD.  This visit occurred during the SARS-CoV-2 public health emergency.  Safety protocols were in place, including screening questions prior to the visit, additional usage of staff PPE, and extensive cleaning of exam room while observing appropriate contact time as indicated for disinfecting solutions.  Subjective:     Patient ID: Joel Wiley , male    DOB: 1976/10/10 , 45 y.o.   MRN: [id number]   Chief Complaint  Patient presents with  . Hyperlipidemia    HPI  The patient is here today for a follow-up on his cholesterol.  He was started on pravastatin 80mg  daily at his last visit. He has not had any issues with the medication.  He denies having leg cramps, myalgias, etc. He adds that he has started to meal prep, and he has incorporated more exercise into his daily routine.     Past Medical History:  Diagnosis Date  . Diabetes (HCC)   . High cholesterol   . Hypertension   . Morbid obesity with BMI of 40.0-44.9, adult (HCC)      Family History  Problem Relation Age of Onset  . Hyperlipidemia Mother   . Hypertension Mother   . Diabetes Mother   . Stroke Mother   . Heart disease Father        heavy smoker  . Hypertension Father   . Hyperlipidemia Father      Current Outpatient Medications:  .  aspirin EC 81 MG tablet, Take 81 mg by mouth daily., Disp: , Rfl:  .  lisinopril (ZESTRIL) 5 MG tablet, Take 1 tablet (5 mg total) by mouth daily., Disp: 90 tablet, Rfl: 2 .  metFORMIN (GLUCOPHAGE) 500 MG tablet, TAKE 1 TABLET (500 MG TOTAL) BY MOUTH 2 (TWO) TIMES DAILY WITH A MEAL., Disp: 180 tablet, Rfl: 2 .  pravastatin (PRAVACHOL) 80 MG tablet, Take 1 tablet (80 mg total) by mouth daily., Disp: 90 tablet, Rfl: 1 .  Semaglutide, 1 MG/DOSE, (OZEMPIC, 1 MG/DOSE,) 2  MG/1.5ML SOPN, Inject 1 mg into the skin once a week., Disp: 2 pen, Rfl: 5   No Known Allergies   Review of Systems  Constitutional: Negative.   Respiratory: Negative.   Cardiovascular: Negative.   Gastrointestinal: Negative.   Psychiatric/Behavioral: Negative.   All other systems reviewed and are negative.    Today's Vitals   12/31/20 1138  BP: 116/74  Pulse: 98  Temp: 98.2 F (36.8 C)  TempSrc: Oral  Weight: (!) 315 lb 3.2 oz (143 kg)  Height: 6' 0.2" (1.834 m)   Body mass index is 42.51 kg/m.  Wt Readings from Last 3 Encounters:  12/31/20 (!) 315 lb 3.2 oz (143 kg)  11/11/20 (!) 329 lb (149.2 kg)  09/03/20 (!) 323 lb 6.4 oz (146.7 kg)   Objective:  Physical Exam Vitals and nursing note reviewed.  Constitutional:      Appearance: Normal appearance. He is obese.  HENT:     Head: Normocephalic and atraumatic.  Cardiovascular:     Rate and Rhythm: Normal rate and regular rhythm.     Heart sounds: Normal heart sounds.  Pulmonary:     Breath sounds: Normal breath sounds.  Skin:    General: Skin is warm.  Neurological:     General: No focal deficit present.  Mental Status: He is alert and oriented to person, place, and time.         Assessment And Plan:     1. Pure hypercholesterolemia Comments: I will check labs as listed below. I will adjust meds as needed.  - Lipid panel - ALT  2. Class 3 severe obesity due to excess calories with serious comorbidity and body mass index (BMI) of 40.0 to 44.9 in adult [facility]) Comments: BMI 42. He was congratulated on his 14 pound weight loss. He is encouraged to continue with his efforts.   3. Drug therapy  Patient was given opportunity to ask questions. Patient verbalized understanding of the plan and was able to repeat key elements of the plan. All questions were answered to their satisfaction.  [name], MD   I, [name], MD, have reviewed all documentation for this visit. The documentation on 01/01/21  for the exam, diagnosis, procedures, and orders are all accurate and complete.  THE PATIENT IS ENCOURAGED TO PRACTICE SOCIAL DISTANCING DUE TO THE COVID-19 PANDEMIC.

## 2021-01-01 LAB — ALT: ALT: 43 IU/L (ref 0–44)

## 2021-01-01 LAB — LIPID PANEL
Chol/HDL Ratio: 3.5 ratio (ref 0.0–5.0)
Cholesterol, Total: 151 mg/dL (ref 100–199)
HDL: 43 mg/dL (ref >39)
LDL Chol Calc (NIH): 94 mg/dL (ref 0–99)
Triglycerides: 73 mg/dL (ref 0–149)
VLDL Cholesterol Cal: 14 mg/dL (ref 5–40)

## 2021-02-04 ENCOUNTER — Other Ambulatory Visit: Payer: Self-pay | Admitting: Internal Medicine

## 2021-02-15 ENCOUNTER — Other Ambulatory Visit: Payer: Self-pay | Admitting: Internal Medicine

## 2021-03-13 ENCOUNTER — Ambulatory Visit: Payer: Commercial Managed Care - PPO | Admitting: Internal Medicine

## 2021-03-17 ENCOUNTER — Ambulatory Visit (INDEPENDENT_AMBULATORY_CARE_PROVIDER_SITE_OTHER): Payer: Commercial Managed Care - PPO | Admitting: Internal Medicine

## 2021-03-17 ENCOUNTER — Encounter: Payer: Self-pay | Admitting: Internal Medicine

## 2021-03-17 ENCOUNTER — Other Ambulatory Visit: Payer: Self-pay

## 2021-03-17 VITALS — BP 130/84 | HR 90 | Temp 97.9°F | Ht 72.2 in | Wt 296.4 lb

## 2021-03-17 DIAGNOSIS — I10 Essential (primary) hypertension: Secondary | ICD-10-CM

## 2021-03-17 DIAGNOSIS — E1165 Type 2 diabetes mellitus with hyperglycemia: Secondary | ICD-10-CM

## 2021-03-17 DIAGNOSIS — Z6839 Body mass index (BMI) 39.0-39.9, adult: Secondary | ICD-10-CM

## 2021-03-17 DIAGNOSIS — Z1211 Encounter for screening for malignant neoplasm of colon: Secondary | ICD-10-CM | POA: Diagnosis not present

## 2021-03-17 NOTE — Patient Instructions (Signed)
Diabetes Mellitus and Exercise Exercising regularly is important for overall health, especially for people who have diabetes mellitus. Exercising is not only about losing weight. It has many other health benefits, such as increasing muscle strength and bone density and reducing body fat and stress. This leads to improved fitness, flexibility, and endurance, all of which result in better overall health. What are the benefits of exercise if I have diabetes? Exercise has many benefits for people with diabetes. They include:  Helping to lower and control blood sugar (glucose).  Helping the body to respond better to the hormone insulin by improving insulin sensitivity.  Reducing how much insulin the body needs.  Lowering the risk for heart disease by: ? Lowering "bad" cholesterol and triglyceride levels. ? Increasing "good" cholesterol levels. ? Lowering blood pressure. ? Lowering blood glucose levels. What is my activity plan? Your health care provider or certified diabetes educator can help you make a plan for the type and frequency of exercise that works for you. This is called your activity plan. Be sure to:  Get at least 150 minutes of medium-intensity or high-intensity exercise each week. Exercises may include brisk walking, biking, or water aerobics.  Do stretching and strengthening exercises, such as yoga or weight lifting, at least 2 times a week.  Spread out your activity over at least 3 days of the week.  Get some form of physical activity each day. ? Do not go more than 2 days in a row without some kind of physical activity. ? Avoid being inactive for more than 90 minutes at a time. Take frequent breaks to walk or stretch.  Choose exercises or activities that you enjoy. Set realistic goals.  Start slowly and gradually increase your exercise intensity over time.   How do I manage my diabetes during exercise? Monitor your blood glucose  Check your blood glucose before and  after exercising. If your blood glucose is: ? 240 mg/dL (13.3 mmol/L) or higher before you exercise, check your urine for ketones. These are chemicals created by the liver. If you have ketones in your urine, do not exercise until your blood glucose returns to normal. ? 100 mg/dL (5.6 mmol/L) or lower, eat a snack containing 15-20 grams of carbohydrate. Check your blood glucose 15 minutes after the snack to make sure that your glucose level is above 100 mg/dL (5.6 mmol/L) before you start your exercise.  Know the symptoms of low blood glucose (hypoglycemia) and how to treat it. Your risk for hypoglycemia increases during and after exercise. Follow these tips and your health care provider's instructions  Keep a carbohydrate snack that is fast-acting for use before, during, and after exercise to help prevent or treat hypoglycemia.  Avoid injecting insulin into areas of the body that are going to be exercised. For example, avoid injecting insulin into: ? Your arms, when you are about to play tennis. ? Your legs, when you are about to go jogging.  Keep records of your exercise habits. Doing this can help you and your health care provider adjust your diabetes management plan as needed. Write down: ? Food that you eat before and after you exercise. ? Blood glucose levels before and after you exercise. ? The type and amount of exercise you have done.  Work with your health care provider when you start a new exercise or activity. He or she may need to: ? Make sure that the activity is safe for you. ? Adjust your insulin, other medicines, and food that   you eat.  Drink plenty of water while you exercise. This prevents loss of water (dehydration) and problems caused by a lot of heat in the body (heat stroke).   Where to find more information  American Diabetes Association: www.diabetes.org Summary  Exercising regularly is important for overall health, especially for people who have diabetes  mellitus.  Exercising has many health benefits. It increases muscle strength and bone density and reduces body fat and stress. It also lowers and controls blood glucose.  Your health care provider or certified diabetes educator can help you make an activity plan for the type and frequency of exercise that works for you.  Work with your health care provider to make sure any new activity is safe for you. Also work with your health care provider to adjust your insulin, other medicines, and the food you eat. This information is not intended to replace advice given to you by your health care provider. Make sure you discuss any questions you have with your health care provider. Document Revised: 08/21/2019 Document Reviewed: 08/21/2019 Elsevier Patient Education  2021 Elsevier Inc.  

## 2021-03-17 NOTE — Progress Notes (Signed)
I,[name],acting as a Education administrator for [name], MD.,have documented all relevant documentation on the behalf of [name], MD,as directed by  [name], MD while in the presence of [name], MD.  This visit occurred during the SARS-CoV-2 public health emergency.  Safety protocols were in place, including screening questions prior to the visit, additional usage of staff PPE, and extensive cleaning of exam room while observing appropriate contact time as indicated for disinfecting solutions.  Subjective:     Patient ID: Joel Wiley , male    DOB: 12-Feb-1976 , 45 y.o.   MRN: [id number]   Chief Complaint  Patient presents with  . Diabetes  . Hypertension    HPI  He is here today for diabetes follow-up.  He reports he is still following his meal prep and has continued to lose weight. He has noticed that his sugars are in the 60s on some mornings. He states he can always tell when his sugars will be low.   Diabetes He presents for his follow-up diabetic visit. He has type 2 diabetes mellitus. His disease course has been improving. There are no hypoglycemic associated symptoms. Pertinent negatives for diabetes include no blurred vision. There are no hypoglycemic complications. Risk factors for coronary artery disease include diabetes mellitus, dyslipidemia, hypertension, obesity and male sex. He participates in exercise intermittently. His breakfast blood glucose is taken between 8-9 am. His breakfast blood glucose range is generally 110-130 mg/dl. Eye exam is not current.  Hypertension This is a chronic problem. The current episode started more than 1 year ago. The problem has been gradually improving since onset. The problem is controlled. Pertinent negatives include no blurred vision. The current treatment provides moderate improvement.     Past Medical History:  Diagnosis Date  . Diabetes ([place])   . High cholesterol   . Hypertension   . Morbid obesity with  BMI of 40.0-44.9, adult (HCC)      Family History  Problem Relation Age of Onset  . Hyperlipidemia Mother   . Hypertension Mother   . Diabetes Mother   . Stroke Mother   . Heart disease Father        heavy smoker  . Hypertension Father   . Hyperlipidemia Father      Current Outpatient Medications:  .  aspirin EC 81 MG tablet, Take 81 mg by mouth daily., Disp: , Rfl:  .  lisinopril (ZESTRIL) 5 MG tablet, TAKE 1 TABLET BY MOUTH EVERY DAY, Disp: 90 tablet, Rfl: 2 .  metFORMIN (GLUCOPHAGE) 500 MG tablet, TAKE 1 TABLET (500 MG TOTAL) BY MOUTH 2 (TWO) TIMES DAILY WITH A MEAL., Disp: 180 tablet, Rfl: 2 .  OZEMPIC, 1 MG/DOSE, 4 MG/3ML SOPN, INJECT 1 MG IN TO THE SKIN ONCE WEEKLY, Disp: 3 mL, Rfl: 3 .  pravastatin (PRAVACHOL) 80 MG tablet, Take 1 tablet (80 mg total) by mouth daily., Disp: 90 tablet, Rfl: 1   No Known Allergies   Review of Systems  Constitutional: Negative.   Eyes: Negative for blurred vision.  Respiratory: Negative.   Cardiovascular: Negative.   Gastrointestinal: Negative.   Psychiatric/Behavioral: Negative.   All other systems reviewed and are negative.    Today's Vitals   03/17/21 1030  BP: 130/84  Pulse: 90  Temp: 97.9 F (36.6 C)  TempSrc: Oral  Weight: 296 lb 6.4 oz (134.4 kg)  Height: 6' 0.2" (1.834 m)   Body mass index is 39.98 kg/m.  Wt Readings from Last 3 Encounters:  03/17/21 296 lb 6.4 oz (134.4 kg)  12/31/20 (!) 315 lb 3.2 oz (143 kg)  11/11/20 (!) 329 lb (149.2 kg)   Objective:  Physical Exam Vitals and nursing note reviewed.  Constitutional:      Appearance: Normal appearance.  HENT:     Nose:     Comments: Masked     Mouth/Throat:     Comments: Masked  Cardiovascular:     Rate and Rhythm: Normal rate and regular rhythm.     Heart sounds: Normal heart sounds.  Pulmonary:     Effort: Pulmonary effort is normal.     Breath sounds: Normal breath sounds.  Musculoskeletal:     Cervical back: Normal range of motion.  Skin:     General: Skin is warm.  Neurological:     General: No focal deficit present.     Mental Status: He is alert.  Psychiatric:        Mood and Affect: Mood normal.         Assessment And Plan:     1. Uncontrolled type 2 diabetes mellitus with hyperglycemia ([place]) Comments: Due to low blood sugars, I will decrease metformin to once daily. I will check labs as below and adjust meds further as needed.  - BMP8+EGFR - Hemoglobin A1c  2. Essential hypertension Comments: Controlled. He will c/w lisinopril 45m daily.   3. Class 2 severe obesity due to excess calories with serious comorbidity and body mass index (BMI) of 39.0 to 39.9 in adult ([facility] Comments: He has lost another 19 pounds since Jan 2022. He was congratulated and encouraged to keep up the great work.   4. Screen for colon cancer Comments: I will refer him to GI for CRC screening. He is in agreement with his treatment plan.  - Ambulatory referral to Gastroenterology     Patient was given opportunity to ask questions. Patient verbalized understanding of the plan and was able to repeat key elements of the plan. All questions were answered to their satisfaction.    I, RMaximino Greenland MD, have reviewed all documentation for this visit. The documentation on 03/17/21 for the exam, diagnosis, procedures, and orders are all accurate and complete.   IF YOU HAVE BEEN REFERRED TO A SPECIALIST, IT MAY TAKE 1-2 WEEKS TO SCHEDULE/PROCESS THE REFERRAL. IF YOU HAVE NOT HEARD FROM US/SPECIALIST IN TWO WEEKS, PLEASE GIVE [place] CALL AT [phone].   THE PATIENT IS ENCOURAGED TO PRACTICE SOCIAL DISTANCING DUE TO THE COVID-19 PANDEMIC.

## 2021-03-18 LAB — BMP8+EGFR
BUN/Creatinine Ratio: 14 (ref 9–20)
BUN: 17 mg/dL (ref 6–24)
CO2: 21 mmol/L (ref 20–29)
Calcium: 9.9 mg/dL (ref 8.7–10.2)
Chloride: 102 mmol/L (ref 96–106)
Creatinine, Ser: 1.18 mg/dL (ref 0.76–1.27)
Glucose: 110 mg/dL — ABNORMAL HIGH (ref 65–99)
Potassium: 4.1 mmol/L (ref 3.5–5.2)
Sodium: 140 mmol/L (ref 134–144)
eGFR: 77 mL/min/{1.73_m2} (ref >59)

## 2021-03-18 LAB — HEMOGLOBIN A1C
Est. average glucose Bld gHb Est-mCnc: 131 mg/dL
Hgb A1c MFr Bld: 6.2 % — ABNORMAL HIGH (ref 4.8–5.6)

## 2021-04-10 LAB — HM COLONOSCOPY

## 2021-05-17 ENCOUNTER — Other Ambulatory Visit: Payer: Self-pay | Admitting: Internal Medicine

## 2021-07-01 ENCOUNTER — Ambulatory Visit: Payer: Commercial Managed Care - PPO | Admitting: Internal Medicine

## 2021-07-23 ENCOUNTER — Other Ambulatory Visit: Payer: Self-pay | Admitting: Internal Medicine

## 2021-09-28 ENCOUNTER — Encounter: Payer: Self-pay | Admitting: Internal Medicine

## 2021-09-30 ENCOUNTER — Ambulatory Visit (INDEPENDENT_AMBULATORY_CARE_PROVIDER_SITE_OTHER): Payer: Commercial Managed Care - PPO | Admitting: Internal Medicine

## 2021-09-30 ENCOUNTER — Other Ambulatory Visit: Payer: Self-pay

## 2021-09-30 ENCOUNTER — Encounter: Payer: Self-pay | Admitting: Internal Medicine

## 2021-09-30 VITALS — BP 126/70 | HR 92 | Temp 98.1°F | Ht 72.2 in | Wt 319.6 lb

## 2021-09-30 DIAGNOSIS — E1165 Type 2 diabetes mellitus with hyperglycemia: Secondary | ICD-10-CM | POA: Diagnosis not present

## 2021-09-30 DIAGNOSIS — E7800 Pure hypercholesterolemia, unspecified: Secondary | ICD-10-CM

## 2021-09-30 DIAGNOSIS — E1169 Type 2 diabetes mellitus with other specified complication: Secondary | ICD-10-CM | POA: Diagnosis not present

## 2021-09-30 DIAGNOSIS — I10 Essential (primary) hypertension: Secondary | ICD-10-CM | POA: Diagnosis not present

## 2021-09-30 DIAGNOSIS — Z6841 Body Mass Index (BMI) 40.0 and over, adult: Secondary | ICD-10-CM

## 2021-09-30 NOTE — Patient Instructions (Signed)

## 2021-09-30 NOTE — Progress Notes (Signed)
I,[name],acting as a Education administrator for [name], MD.,have documented all relevant documentation on the behalf of [name], MD,as directed by  [name], MD while in the presence of [name], MD.  This visit occurred during the SARS-CoV-2 public health emergency.  Safety protocols were in place, including screening questions prior to the visit, additional usage of staff PPE, and extensive cleaning of exam room while observing appropriate contact time as indicated for disinfecting solutions.  Subjective:     Patient ID: Joel Wiley , male    DOB: [birth year] , 45 y.o.   MRN: [id number]   Chief Complaint  Patient presents with   Diabetes   Hypertension    HPI  He is here today for diabetes/HTN follow-up.  He reports compliance with meds. He is still following E2M, but realized he was eating the wrong type of beef. He was choosing cuts with more fat, not lean varieties. He is getting back on track with the next round. He also needs paperwork filled out for work, biometrics.   Diabetes He presents for his follow-up diabetic visit. He has type 2 diabetes mellitus. His disease course has been improving. There are no hypoglycemic associated symptoms. Pertinent negatives for diabetes include no blurred vision. There are no hypoglycemic complications. Risk factors for coronary artery disease include diabetes mellitus, dyslipidemia, hypertension, obesity and male sex. He participates in exercise intermittently. His breakfast blood glucose is taken between 8-9 am. His breakfast blood glucose range is generally 110-130 mg/dl. Eye exam is not current.  Hypertension This is a chronic problem. The current episode started more than 1 year ago. The problem has been gradually improving since onset. The problem is controlled. Pertinent negatives include no blurred vision. The current treatment provides moderate improvement.    Past Medical History:  Diagnosis Date   Diabetes  ([place])    High cholesterol    Hypertension    Morbid obesity with BMI of 40.0-44.9, adult ([place])      Family History  Problem Relation Age of Onset   Hyperlipidemia Mother    Hypertension Mother    Diabetes Mother    Stroke Mother    Heart disease Father        heavy smoker   Hypertension Father    Hyperlipidemia Father      Current Outpatient Medications:    aspirin EC 81 MG tablet, Take 81 mg by mouth daily., Disp: , Rfl:    lisinopril (ZESTRIL) 5 MG tablet, TAKE 1 TABLET BY MOUTH EVERY DAY, Disp: 90 tablet, Rfl: 2   metFORMIN (GLUCOPHAGE) 500 MG tablet, TAKE 1 TABLET BY MOUTH 2 TIMES DAILY WITH A MEAL., Disp: 180 tablet, Rfl: 2   pravastatin (PRAVACHOL) 80 MG tablet, TAKE 1 TABLET BY MOUTH EVERY DAY, Disp: 90 tablet, Rfl: 1   OZEMPIC, 1 MG/DOSE, 4 MG/3ML SOPN, INJECT 1 MG IN TO THE SKIN ONCE WEEKLY (Patient not taking: Reported on 09/30/2021), Disp: 3 mL, Rfl: 3   No Known Allergies   Review of Systems  Constitutional: Negative.   Eyes:  Negative for blurred vision.  Respiratory: Negative.    Cardiovascular: Negative.   Gastrointestinal: Negative.   Neurological: Negative.   Psychiatric/Behavioral: Negative.      Today's Vitals   09/30/21 1128  BP: 126/70  Pulse: 92  Temp: 98.1 F (36.7 C)  Weight: (!) 319 lb 9.6 oz (145 kg)  Height: 6' 0.2" (1.834 m)   Body mass index is 43.11 kg/m.  Wt  Readings from Last 3 Encounters:  09/30/21 (!) 319 lb 9.6 oz (145 kg)  03/17/21 296 lb 6.4 oz (134.4 kg)  12/31/20 (!) 315 lb 3.2 oz (143 kg)    BP Readings from Last 3 Encounters:  09/30/21 126/70  03/17/21 130/84  12/31/20 116/74    Objective:  Physical Exam Vitals and nursing note reviewed.  Constitutional:      Appearance: Normal appearance. He is obese.  HENT:     Head: Normocephalic and atraumatic.     Nose:     Comments: Masked     Mouth/Throat:     Comments: Masked  Eyes:     Extraocular Movements: Extraocular movements intact.  Cardiovascular:     Rate  and Rhythm: Normal rate and regular rhythm.     Heart sounds: Normal heart sounds.  Pulmonary:     Effort: Pulmonary effort is normal.     Breath sounds: Normal breath sounds.  Musculoskeletal:     Cervical back: Normal range of motion.  Skin:    General: Skin is warm.  Neurological:     General: No focal deficit present.     Mental Status: He is alert.  Psychiatric:        Mood and Affect: Mood normal.        Assessment And Plan:     1. Uncontrolled type 2 diabetes mellitus with hyperglycemia (HCC) Comments: Chronic, I will check labs as listed below.  Encouraged to follow dietary guidelines. I will adjusts meds as needed.  He will f/u in 3 months for CPE.  - Hemoglobin A1c - CMP14+EGFR - Lipid panel  2. Essential hypertension Comments: Chronic, well controlled. He is encouraged to follow   3. Pure hypercholesterolemia Comments: Chronic, currently on pravastatin. I will check lipid panel today.   4. Class 3 severe obesity due to excess calories with serious comorbidity and body mass index (BMI) of 40.0 to 44.9 in adult [facility]) Comments: He has gained 23 lbs since April 2022. He plans to do what's necessary to get back on track. He agrees to f/u in 3 months for CPE.    Patient was given opportunity to ask questions. Patient verbalized understanding of the plan and was able to repeat key elements of the plan. All questions were answered to their satisfaction.   I, [name], MD, have reviewed all documentation for this visit. The documentation on 09/30/21 for the exam, diagnosis, procedures, and orders are all accurate and complete.   IF YOU HAVE BEEN REFERRED TO A SPECIALIST, IT MAY TAKE 1-2 WEEKS TO SCHEDULE/PROCESS THE REFERRAL. IF YOU HAVE NOT HEARD FROM US/SPECIALIST IN TWO WEEKS, PLEASE GIVE [place] A CALL AT [phone].   THE PATIENT IS ENCOURAGED TO PRACTICE SOCIAL DISTANCING DUE TO THE COVID-19 PANDEMIC.

## 2021-10-01 LAB — CMP14+EGFR
ALT: 23 IU/L (ref 0–44)
AST: 18 IU/L (ref 0–40)
Albumin/Globulin Ratio: 1.6 (ref 1.2–2.2)
Albumin: 4.6 g/dL (ref 4.0–5.0)
Alkaline Phosphatase: 42 IU/L — ABNORMAL LOW (ref 44–121)
BUN/Creatinine Ratio: 18 (ref 9–20)
BUN: 19 mg/dL (ref 6–24)
Bilirubin Total: 0.6 mg/dL (ref 0.0–1.2)
CO2: 21 mmol/L (ref 20–29)
Calcium: 9.4 mg/dL (ref 8.7–10.2)
Chloride: 102 mmol/L (ref 96–106)
Creatinine, Ser: 1.05 mg/dL (ref 0.76–1.27)
Globulin, Total: 2.9 g/dL (ref 1.5–4.5)
Glucose: 150 mg/dL — ABNORMAL HIGH (ref 70–99)
Potassium: 4.4 mmol/L (ref 3.5–5.2)
Sodium: 137 mmol/L (ref 134–144)
Total Protein: 7.5 g/dL (ref 6.0–8.5)
eGFR: 88 mL/min/{1.73_m2} (ref >59)

## 2021-10-01 LAB — HEMOGLOBIN A1C
Est. average glucose Bld gHb Est-mCnc: 160 mg/dL
Hgb A1c MFr Bld: 7.2 % — ABNORMAL HIGH (ref 4.8–5.6)

## 2021-10-01 LAB — LIPID PANEL
Chol/HDL Ratio: 6 ratio — ABNORMAL HIGH (ref 0.0–5.0)
Cholesterol, Total: 257 mg/dL — ABNORMAL HIGH (ref 100–199)
HDL: 43 mg/dL (ref >39)
LDL Chol Calc (NIH): 186 mg/dL — ABNORMAL HIGH (ref 0–99)
Triglycerides: 152 mg/dL — ABNORMAL HIGH (ref 0–149)
VLDL Cholesterol Cal: 28 mg/dL (ref 5–40)

## 2021-10-02 ENCOUNTER — Encounter: Payer: Self-pay | Admitting: Internal Medicine

## 2021-11-25 ENCOUNTER — Encounter: Payer: Commercial Managed Care - PPO | Admitting: Internal Medicine

## 2021-12-06 ENCOUNTER — Encounter: Payer: Self-pay | Admitting: Internal Medicine

## 2021-12-07 ENCOUNTER — Encounter: Payer: Self-pay | Admitting: Internal Medicine

## 2021-12-17 ENCOUNTER — Other Ambulatory Visit: Payer: Self-pay

## 2021-12-17 ENCOUNTER — Encounter: Payer: Self-pay | Admitting: Internal Medicine

## 2021-12-17 ENCOUNTER — Ambulatory Visit (INDEPENDENT_AMBULATORY_CARE_PROVIDER_SITE_OTHER): Payer: BC Managed Care – PPO | Admitting: Internal Medicine

## 2021-12-17 VITALS — BP 126/74 | HR 90 | Temp 98.3°F | Ht 72.2 in | Wt 318.2 lb

## 2021-12-17 DIAGNOSIS — I10 Essential (primary) hypertension: Secondary | ICD-10-CM

## 2021-12-17 DIAGNOSIS — Z6841 Body Mass Index (BMI) 40.0 and over, adult: Secondary | ICD-10-CM

## 2021-12-17 DIAGNOSIS — Z0000 Encounter for general adult medical examination without abnormal findings: Secondary | ICD-10-CM

## 2021-12-17 DIAGNOSIS — Z79899 Other long term (current) drug therapy: Secondary | ICD-10-CM | POA: Diagnosis not present

## 2021-12-17 DIAGNOSIS — Z23 Encounter for immunization: Secondary | ICD-10-CM

## 2021-12-17 DIAGNOSIS — I83813 Varicose veins of bilateral lower extremities with pain: Secondary | ICD-10-CM

## 2021-12-17 DIAGNOSIS — E785 Hyperlipidemia, unspecified: Secondary | ICD-10-CM | POA: Diagnosis not present

## 2021-12-17 DIAGNOSIS — E1165 Type 2 diabetes mellitus with hyperglycemia: Secondary | ICD-10-CM | POA: Diagnosis not present

## 2021-12-17 DIAGNOSIS — E7800 Pure hypercholesterolemia, unspecified: Secondary | ICD-10-CM | POA: Diagnosis not present

## 2021-12-17 LAB — POCT URINALYSIS DIPSTICK
Bilirubin, UA: NEGATIVE
Blood, UA: NEGATIVE
Glucose, UA: NEGATIVE
Ketones, UA: NEGATIVE
Leukocytes, UA: NEGATIVE
Nitrite, UA: NEGATIVE
Protein, UA: NEGATIVE
Spec Grav, UA: 1.025 (ref 1.010–1.025)
Urobilinogen, UA: 0.2 E.U./dL
pH, UA: 6 (ref 5.0–8.0)

## 2021-12-17 LAB — POC HEMOCCULT BLD/STL (OFFICE/1-CARD/DIAGNOSTIC): Fecal Occult Blood, POC: NEGATIVE

## 2021-12-17 LAB — POCT UA - MICROALBUMIN
Albumin/Creatinine Ratio, Urine, POC: <30
Creatinine, POC: 50 mg/dL
Microalbumin Ur, POC: 10 mg/L

## 2021-12-17 NOTE — Progress Notes (Signed)
I,Joel Wiley,acting as a Education administrator for Joel Greenland, MD.,have documented all relevant documentation on the behalf of Joel Greenland, MD,as directed by  Joel Greenland, MD while in the presence of Joel Greenland, MD.  This visit occurred during the SARS-CoV-2 public health emergency.  Safety protocols were in place, including screening questions prior to the visit, additional usage of staff PPE, and extensive cleaning of exam room while observing appropriate contact time as indicated for disinfecting solutions.  Subjective:     Patient ID: Joel Wiley , male    DOB: 1976-03-03 , 46 y.o.   MRN: [id number]   Chief Complaint  Patient presents with   Annual Exam   Diabetes   Hypertension    HPI  Patient here for physical exam. He denies having any specific concerns at this time. He reports compliance with meds. He denies headaches, chest pain and shortness of breath.   Diabetes He presents for his follow-up diabetic visit. He has type 2 diabetes mellitus. His disease course has been improving. Pertinent negatives for diabetes include no blurred vision. There are no hypoglycemic complications. Risk factors for coronary artery disease include diabetes mellitus, dyslipidemia, hypertension, obesity and male sex. He participates in exercise intermittently. His breakfast blood glucose is taken between 8-9 am. His breakfast blood glucose range is generally 110-130 mg/dl. Eye exam is not current.  Hypertension This is a chronic problem. The current episode started more than 1 year ago. The problem has been gradually improving since onset. The problem is controlled. Pertinent negatives include no blurred vision. The current treatment provides moderate improvement.    Past Medical History:  Diagnosis Date   Diabetes ([place])    High cholesterol    Hypertension    Morbid obesity with BMI of 40.0-44.9, adult ([place])      Family History  Problem Relation Age of Onset   Hyperlipidemia Mother     Hypertension Mother    Diabetes Mother    Stroke Mother    Heart disease Father        heavy smoker   Hypertension Father    Hyperlipidemia Father      Current Outpatient Medications:    aspirin EC 81 MG tablet, Take 81 mg by mouth daily., Disp: , Rfl:    lisinopril (ZESTRIL) 5 MG tablet, TAKE 1 TABLET BY MOUTH EVERY DAY, Disp: 90 tablet, Rfl: 2   metFORMIN (GLUCOPHAGE) 500 MG tablet, TAKE 1 TABLET BY MOUTH 2 TIMES DAILY WITH A MEAL., Disp: 180 tablet, Rfl: 2   pravastatin (PRAVACHOL) 80 MG tablet, TAKE 1 TABLET BY MOUTH EVERY DAY, Disp: 90 tablet, Rfl: 1   OZEMPIC, 1 MG/DOSE, 4 MG/3ML SOPN, INJECT 1 MG IN TO THE SKIN ONCE WEEKLY (Patient not taking: Reported on 09/30/2021), Disp: 3 mL, Rfl: 3   No Known Allergies   Review of Systems  Constitutional: Negative.   HENT: Negative.    Eyes: Negative.  Negative for blurred vision.  Respiratory: Negative.    Cardiovascular: Negative.   Gastrointestinal: Negative.   Endocrine: Negative.   Genitourinary: Negative.   Musculoskeletal: Negative.   Skin: Negative.   Allergic/Immunologic: Negative.   Neurological: Negative.   Hematological: Negative.   Psychiatric/Behavioral: Negative.      Today's Vitals   12/17/21 1507  BP: 126/74  Pulse: 90  Temp: 98.3 F (36.8 C)  Weight: (!) 318 lb 3.2 oz (144.3 kg)  Height: 6' 0.2" (1.834 m)   Body mass index is 42.92 kg/m.  Wt  Readings from Last 3 Encounters:  12/17/21 (!) 318 lb 3.2 oz (144.3 kg)  09/30/21 (!) 319 lb 9.6 oz (145 kg)  03/17/21 296 lb 6.4 oz (134.4 kg)    BP Readings from Last 3 Encounters:  12/17/21 126/74  09/30/21 126/70  03/17/21 130/84    Objective:  Physical Exam Vitals and nursing note reviewed.  Constitutional:      Appearance: Normal appearance.  HENT:     Head: Normocephalic and atraumatic.     Right Ear: Tympanic membrane, ear canal and external ear normal.     Left Ear: Tympanic membrane, ear canal and external ear normal.     Nose:      Comments: Masked     Mouth/Throat:     Comments: Masked  Eyes:     Extraocular Movements: Extraocular movements intact.     Conjunctiva/sclera: Conjunctivae normal.     Pupils: Pupils are equal, round, and reactive to light.  Cardiovascular:     Rate and Rhythm: Normal rate and regular rhythm.     Pulses:          Dorsalis pedis pulses are 2+ on the right side and 2+ on the left side.       Posterior tibial pulses are 1+ on the right side and 1+ on the left side.     Heart sounds: Normal heart sounds.     Comments: Varicose veins b/l LE  Pulmonary:     Effort: Pulmonary effort is normal.     Breath sounds: Normal breath sounds.  Chest:  Breasts:    Right: Normal. No swelling, bleeding, inverted nipple, mass or nipple discharge.     Left: Normal. No swelling, bleeding, inverted nipple, mass or nipple discharge.  Abdominal:     General: Bowel sounds are normal.     Palpations: Abdomen is soft.     Comments: Obese, soft.   Genitourinary:    Prostate: Normal.     Rectum: Normal. Guaiac result negative.  Musculoskeletal:        General: Normal range of motion.     Cervical back: Normal range of motion and neck supple.  Feet:     Right foot:     Protective Sensation: 5 sites tested.  5 sites sensed.     Skin integrity: Callus present.     Toenail Condition: Right toenails are long.     Left foot:     Protective Sensation: 5 sites tested.  5 sites sensed.     Skin integrity: Callus and dry skin present.     Toenail Condition: Left toenails are long.  Skin:    General: Skin is warm.  Neurological:     General: No focal deficit present.     Mental Status: He is alert.  Psychiatric:        Mood and Affect: Mood normal.        Behavior: Behavior normal.        Assessment And Plan:     1. Encounter for general adult medical examination w/o abnormal findings Comments: A full exam was performed. DRE performed. stool is heme negative. PATIENT IS ADVISED TO GET 30-45 MINUTES  REGULAR EXERCISE NO LESS THAN FOUR TO FIVE DAYS PER WEEK - BOTH WEIGHTBEARING EXERCISES AND AEROBIC ARE RECOMMENDED.  PATIENT IS ADVISED TO FOLLOW A HEALTHY DIET WITH AT LEAST SIX FRUITS/VEGGIES PER DAY, DECREASE INTAKE OF RED MEAT, AND TO INCREASE FISH INTAKE TO TWO DAYS PER WEEK.  MEATS/FISH SHOULD NOT BE FRIED,  BAKED OR BROILED IS PREFERABLE.  IT IS ALSO IMPORTANT TO CUT BACK ON YOUR SUGAR INTAKE. PLEASE AVOID ANYTHING WITH ADDED SUGAR, CORN SYRUP OR OTHER SWEETENERS. IF YOU MUST USE A SWEETENER, YOU CAN TRY STEVIA. IT IS ALSO IMPORTANT TO AVOID ARTIFICIALLY SWEETENERS AND DIET BEVERAGES. LASTLY, I SUGGEST WEARING SPF 50 SUNSCREEN ON EXPOSED PARTS AND ESPECIALLY WHEN IN THE DIRECT SUNLIGHT FOR AN EXTENDED PERIOD OF TIME.  PLEASE AVOID FAST FOOD RESTAURANTS AND INCREASE YOUR WATER INTAKE.  - CMP14+EGFR - PSA - Lipid panel - Hemoglobin A1c - CBC - POC Hemoccult Bld/Stl (1-Cd Office Dx)  2. Uncontrolled type 2 diabetes mellitus with hyperglycemia ([place]) Comments: Diabetic foot exam was performed. Calluses noted, I will refer him to Podiatry. He will rto in 4 months for re-evaluation. I DISCUSSED WITH THE PATIENT AT LENGTH REGARDING THE GOALS OF GLYCEMIC CONTROL AND POSSIBLE LONG-TERM COMPLICATIONS.  I  ALSO STRESSED THE IMPORTANCE OF COMPLIANCE WITH HOME GLUCOSE MONITORING, DIETARY RESTRICTIONS INCLUDING AVOIDANCE OF SUGARY DRINKS/PROCESSED FOODS,  ALONG WITH REGULAR EXERCISE.  I  ALSO STRESSED THE IMPORTANCE OF ANNUAL EYE EXAMS, SELF FOOT CARE AND COMPLIANCE WITH OFFICE VISITS.  - POCT Urinalysis Dipstick (81002) - POCT UA - Microalbumin - Ambulatory referral to Podiatry  3. Essential hypertension Comments: Chronic, well controlled. EKG performed, NSR w/o acute changes.   No med changes. Advised to follow low sodium diet. - EKG 12-Lead  4. Pure hypercholesterolemia Comments: Chronic, recently resumed pravastatin. If not at goal LDL <70, I will switch to atorvastatin.   5. Varicose veins of  both lower extremities with pain Comments: He has noticed worsening of sx/pain. I will refer him to vein specialist for further evaluation. - Ambulatory referral to Vascular Surgery  6. Class 3 severe obesity due to excess calories with serious comorbidity and body mass index (BMI) of 40.0 to 44.9 in adult [facility]) Comments:  He is encouraged to initially strive for BMI less than 35 to decrease cardiac risk. He is advised to exercise no less than 150 minutes per week.    7. Immunization due Comments: He was given Prevnar-20 IM x 1.  - Pneumococcal conjugate vaccine 20-valent (Prevnar 20)  Patient was given opportunity to ask questions. Patient verbalized understanding of the plan and was able to repeat key elements of the plan. All questions were answered to their satisfaction.   I, Joel Greenland, MD, have reviewed all documentation for this visit. The documentation on 12/17/21 for the exam, diagnosis, procedures, and orders are all accurate and complete.   IF YOU HAVE BEEN REFERRED TO A SPECIALIST, IT MAY TAKE 1-2 WEEKS TO SCHEDULE/PROCESS THE REFERRAL. IF YOU HAVE NOT HEARD FROM US/SPECIALIST IN TWO WEEKS, PLEASE GIVE [place] A CALL AT [phone].   THE PATIENT IS ENCOURAGED TO PRACTICE SOCIAL DISTANCING DUE TO THE COVID-19 PANDEMIC.

## 2021-12-17 NOTE — Patient Instructions (Signed)

## 2021-12-18 LAB — CBC
Hematocrit: 43.2 % (ref 37.5–51.0)
Hemoglobin: 14.5 g/dL (ref 13.0–17.7)
MCH: 30.7 pg (ref 26.6–33.0)
MCHC: 33.6 g/dL (ref 31.5–35.7)
MCV: 92 fL (ref 79–97)
Platelets: 154 10*3/uL (ref 150–450)
RBC: 4.72 x10E6/uL (ref 4.14–5.80)
RDW: 12.3 % (ref 11.6–15.4)
WBC: 7.4 10*3/uL (ref 3.4–10.8)

## 2021-12-18 LAB — LIPID PANEL
Chol/HDL Ratio: 4.5 ratio (ref 0.0–5.0)
Cholesterol, Total: 217 mg/dL — ABNORMAL HIGH (ref 100–199)
HDL: 48 mg/dL (ref >39)
LDL Chol Calc (NIH): 141 mg/dL — ABNORMAL HIGH (ref 0–99)
Triglycerides: 158 mg/dL — ABNORMAL HIGH (ref 0–149)
VLDL Cholesterol Cal: 28 mg/dL (ref 5–40)

## 2021-12-18 LAB — CMP14+EGFR
ALT: 21 IU/L (ref 0–44)
AST: 20 IU/L (ref 0–40)
Albumin/Globulin Ratio: 1.5 (ref 1.2–2.2)
Albumin: 4.4 g/dL (ref 4.0–5.0)
Alkaline Phosphatase: 42 IU/L — ABNORMAL LOW (ref 44–121)
BUN/Creatinine Ratio: 15 (ref 9–20)
BUN: 18 mg/dL (ref 6–24)
Bilirubin Total: 0.4 mg/dL (ref 0.0–1.2)
CO2: 20 mmol/L (ref 20–29)
Calcium: 9.8 mg/dL (ref 8.7–10.2)
Chloride: 103 mmol/L (ref 96–106)
Creatinine, Ser: 1.17 mg/dL (ref 0.76–1.27)
Globulin, Total: 3 g/dL (ref 1.5–4.5)
Glucose: 112 mg/dL — ABNORMAL HIGH (ref 70–99)
Potassium: 4.1 mmol/L (ref 3.5–5.2)
Sodium: 140 mmol/L (ref 134–144)
Total Protein: 7.4 g/dL (ref 6.0–8.5)
eGFR: 77 mL/min/{1.73_m2} (ref >59)

## 2021-12-18 LAB — PSA: Prostate Specific Ag, Serum: 0.7 ng/mL (ref 0.0–4.0)

## 2021-12-18 LAB — HEMOGLOBIN A1C
Est. average glucose Bld gHb Est-mCnc: 160 mg/dL
Hgb A1c MFr Bld: 7.2 % — ABNORMAL HIGH (ref 4.8–5.6)

## 2021-12-25 ENCOUNTER — Other Ambulatory Visit: Payer: Self-pay

## 2021-12-26 ENCOUNTER — Encounter: Payer: Self-pay | Admitting: Podiatry

## 2021-12-26 ENCOUNTER — Ambulatory Visit: Payer: BC Managed Care – PPO | Admitting: Podiatry

## 2021-12-26 ENCOUNTER — Other Ambulatory Visit: Payer: Self-pay

## 2021-12-26 DIAGNOSIS — E119 Type 2 diabetes mellitus without complications: Secondary | ICD-10-CM | POA: Diagnosis not present

## 2021-12-26 DIAGNOSIS — R234 Changes in skin texture: Secondary | ICD-10-CM | POA: Diagnosis not present

## 2021-12-26 NOTE — Progress Notes (Signed)
This patient presents to the office concerned about his left heel  He says he has developed a heel callus on the back of his left heel.  He says it gets thick.  He is a diabetic and he is concerned about this skin lesion.  He presents for evaluation and treatment.  Vascular  Dorsalis pedis and posterior tibial pulses are palpable  B/L.  Capillary return  WNL.  Temperature gradient is  WNL.  Skin turgor  WNL  Sensorium  Senn Weinstein monofilament wire  WNL. Normal tactile sensation.  Nail Exam  Patient has normal nails with no evidence of bacterial or fungal infection. Pincer hallux nails  Orthopedic  Exam  Muscle tone and muscle strength  WNL.  No limitations of motion feet  B/L.  No crepitus or joint effusion noted.  Foot type is unremarkable and digits show no abnormalities.  Bony prominences are unremarkable.  Skin  No open lesions.  Normal skin texture and turgor.  He has developed a left heel callus on plantar posterior of the left heel with fissures.  The fissures are skin deep.  Fissures left heel.  IE.  Debride callus with dremel.  Told him to use vaseline and chapstick on heel callus  Diabetic foot exam performed.  No vascular or neurologic pathology noted.   Helane Gunther DPM

## 2022-01-10 ENCOUNTER — Other Ambulatory Visit: Payer: Self-pay | Admitting: Internal Medicine

## 2022-01-12 ENCOUNTER — Other Ambulatory Visit: Payer: Self-pay

## 2022-01-12 DIAGNOSIS — I8390 Asymptomatic varicose veins of unspecified lower extremity: Secondary | ICD-10-CM

## 2022-01-13 ENCOUNTER — Ambulatory Visit (HOSPITAL_COMMUNITY)
Admission: RE | Admit: 2022-01-13 | Discharge: 2022-01-13 | Disposition: A | Discharge status: Home / Self Care | Payer: BC Managed Care – PPO | Source: Ambulatory Visit | Attending: Vascular Surgery | Admitting: Vascular Surgery

## 2022-01-13 ENCOUNTER — Other Ambulatory Visit: Payer: Self-pay

## 2022-01-13 ENCOUNTER — Ambulatory Visit: Payer: BC Managed Care – PPO | Admitting: Vascular Surgery

## 2022-01-13 ENCOUNTER — Encounter: Payer: Self-pay | Admitting: Vascular Surgery

## 2022-01-13 DIAGNOSIS — I872 Venous insufficiency (chronic) (peripheral): Secondary | ICD-10-CM | POA: Insufficient documentation

## 2022-01-13 DIAGNOSIS — I8390 Asymptomatic varicose veins of unspecified lower extremity: Secondary | ICD-10-CM | POA: Insufficient documentation

## 2022-01-13 DIAGNOSIS — I8392 Asymptomatic varicose veins of left lower extremity: Secondary | ICD-10-CM

## 2022-01-13 NOTE — Progress Notes (Signed)
Patient name: Joel Wiley MRN: [id number] DOB: [birth year] Sex: male  REASON FOR CONSULT: Evaluate varicose veins bilateral lower extremities  HPI: Joel EonScot Guier is a 46 y.o. male, with history of hypertension, hyperlipidemia, diabetes, and obesity who presents for evaluation of varicose veins of the bilateral lower extremities.  He has noticed varicosities particularly in bilateral thighs for years.  He did have bleeding event in 2021 after he got out of the shower and had a left thigh varicosity bleed.  No history of DVT.  He does not wear compression stockings.  He is working on weight loss and was initially down 60 pounds but has gained 40 pounds back.  He works in [employer]ing [employer].  Does feel the left leg is worse than the right and has significant leg swelling with some burning and stinging at the end of the day.  Past Medical History:  Diagnosis Date   Diabetes ([facility])    High cholesterol    Hypertension    Morbid obesity with BMI of 40.0-44.9, adult [facility])     Past Surgical History:  Procedure Laterality Date   WISDOM TOOTH EXTRACTION      Family History  Problem Relation Age of Onset   Hyperlipidemia Mother    Hypertension Mother    Diabetes Mother    Stroke Mother    Heart disease Father        heavy smoker   Hypertension Father    Hyperlipidemia Father     SOCIAL HISTORY: Social History   Socioeconomic History   Marital status: Married    Spouse name: Not on file   Number of children: Not on file   Years of education: Not on file   Highest education level: Not on file  Occupational History   Occupation: Primary school teacherT    Employer: OTHER    Comment: [employer]  Tobacco Use   Smoking status: Never   Smokeless tobacco: Never  Vaping Use   Vaping Use: Never used  Substance and Sexual Activity   Alcohol use: Yes    Comment: occasionally    Drug use: Never   Sexual activity: Yes  Other Topics Concern   Not on file  Social History Narrative   [employer]ayed college basketball [employer]. Pt lives with wife and daughter.   Social Determinants of Health   Financial Resource Strain: Not on file  Food Insecurity: Not on file  Transportation Needs: Not on file  Physical Activity: Not on file  Stress: Not on file  Social Connections: Not on file  Intimate Partner Violence: Not on file    No Known Allergies  Current Outpatient Medications  Medication Sig Dispense Refill   aspirin EC 81 MG tablet Take 81 mg by mouth daily.     lisinopril (ZESTRIL) 5 MG tablet TAKE 1 TABLET BY MOUTH EVERY DAY 90 tablet 2   meloxicam (MOBIC) 15 MG tablet Take 15 mg by mouth daily.     metFORMIN (GLUCOPHAGE) 500 MG tablet TAKE 1 TABLET BY MOUTH 2 TIMES DAILY WITH A MEAL. 180 tablet 2   pravastatin (PRAVACHOL) 80 MG tablet TAKE 1 TABLET BY MOUTH EVERY DAY 90 tablet 1   No current facility-administered medications for this visit.    REVIEW OF SYSTEMS:  [X]  denotes positive finding, [ ]  denotes negative finding Cardiac  Comments:  Chest pain or chest pressure:    Shortness of breath upon exertion:    Short of breath when lying flat:    Irregular heart rhythm:  Vascular    Pain in calf, thigh, or hip brought on by ambulation:    Pain in feet at night that wakes you up from your sleep:     Blood clot in your veins:    Leg swelling:  x Left leg worse       Pulmonary    Oxygen at home:    Productive cough:     Wheezing:         Neurologic    Sudden weakness in arms or legs:     Sudden numbness in arms or legs:     Sudden onset of difficulty speaking or slurred speech:    Temporary loss of vision in one eye:     Problems with dizziness:         Gastrointestinal    Blood in stool:     Vomited blood:         Genitourinary    Burning when urinating:     Blood in urine:        Psychiatric    Major depression:         Hematologic    Bleeding problems:    Problems with blood clotting too easily:        Skin    Rashes or ulcers:         Constitutional    Fever or chills:      PHYSICAL EXAM: Vitals:   01/13/22 1536  BP: 139/87  Pulse: 88  Resp: 18  Temp: 97.6 F (36.4 C)  TempSrc: Temporal  SpO2: 95%  Weight: (!) 319 lb (144.7 kg)  Height: 6\' 3"  (1.905 m)    GENERAL: The patient is a well-nourished male, in no acute distress. The vital signs are documented above. CARDIAC: There is a regular rate and rhythm.  VASCULAR:  Bilateral femoral pulses palpable Bilateral DP pulses palpable Bilateral thigh varicosities noted No skin thickening or open ulcerations PULMONARY: No respiratory distress. ABDOMEN: Soft and non-tender. MUSCULOSKELETAL: There are no major deformities or cyanosis. NEUROLOGIC: No focal weakness or paresthesias are detected. SKIN: There are no ulcers or rashes noted. PSYCHIATRIC: The patient has a normal affect.  DATA:   Indications: Varicosities.     Performing Technologist: RVT      Examination Guidelines: A complete evaluation includes B-mode imaging,  spectral  Doppler, color Doppler, and power Doppler as needed of all accessible  portions  of each vessel. Bilateral testing is considered an integral part of a  complete  examination. Limited examinations for reoccurring indications may be  performed  as noted. The reflux portion of the exam is performed with the patient in  reverse Trendelenburg.  Significant venous reflux is defined as >500 ms in the superficial venous  system, and >1 second in the deep venous system.      +--------------+---------+------+-----------+------------+--------+   LEFT           Reflux No Reflux Reflux Time Diameter cms Comments                              Yes                                       +--------------+---------+------+-----------+------------+--------+   CFV  yes    >1 second                          +--------------+---------+------+-----------+------------+--------+   FV prox        no                                                    +--------------+---------+------+-----------+------------+--------+   FV mid         no                                                   +--------------+---------+------+-----------+------------+--------+   FV dist        no                                                   +--------------+---------+------+-----------+------------+--------+   Popliteal                 yes    >1 second                          +--------------+---------+------+-----------+------------+--------+   GSV at SFJ                yes     >500 ms      0.889                +--------------+---------+------+-----------+------------+--------+   GSV prox thigh no                              0.506                +--------------+---------+------+-----------+------------+--------+   GSV mid thigh  no                               0.35                +--------------+---------+------+-----------+------------+--------+   GSV dist thigh no                              0.425                +--------------+---------+------+-----------+------------+--------+   GSV at knee               yes     >500 ms      0.515                +--------------+---------+------+-----------+------------+--------+   GSV prox calf  no                              0.403                +--------------+---------+------+-----------+------------+--------+   SSV Pop Fossa  no  0.151                +--------------+---------+------+-----------+------------+--------+   SSV prox calf             yes     >500 ms      0.197                +--------------+---------+------+-----------+------------+--------+   SSV mid calf   no                               0.17                +--------------+---------+------+-----------+------------+--------+          Summary:  Left:  - No evidence of deep vein thrombosis seen in the left lower extremity,  from the common femoral through the popliteal veins.   - No evidence of superficial venous thrombosis in the left lower  extremity.     - Deep vein reflux in the CFV and popliteal vein.  - Superficial vein reflux in the SFJ, the GSV at the knee, and in the SSV  proximal calf.     *See table(s) above for measurements and observations.   Electronically signed by Sherald Hess MD on 01/13/2022 at 4:08:13 PM.   Assessment/Plan:  46 year old male with chronic venous insufficiency of the bilateral lower extremities consistent with CEAP classicifation C3 given edema.  The left leg was studied today with a reflux study given the left leg is more symptomatic.  Dominant reflux is in the deep venous system including the common femoral and popliteal veins and discussed this is not amendable to laser ablation.  He does have some focal superficial reflux at the saphenofemoral junction and at the knee but this does not appear long-segment.  I recommended conservative measures with weight loss, exercise, elevation, compression stockings.  We sized him for knee high compression today with 20-30 mm Hg.  He can follow-up with me as needed in the future.  With continued weight loss would be able to convert him to a thigh high stocking.     Cephus Shelling, MD [facility] [place] Office: [phone]

## 2022-01-14 DIAGNOSIS — E119 Type 2 diabetes mellitus without complications: Secondary | ICD-10-CM | POA: Diagnosis not present

## 2022-01-14 DIAGNOSIS — H2513 Age-related nuclear cataract, bilateral: Secondary | ICD-10-CM | POA: Diagnosis not present

## 2022-01-14 LAB — HM DIABETES EYE EXAM

## 2022-01-26 ENCOUNTER — Encounter: Payer: Self-pay | Admitting: Internal Medicine

## 2022-02-01 ENCOUNTER — Encounter: Payer: Self-pay | Admitting: Internal Medicine

## 2022-02-02 ENCOUNTER — Other Ambulatory Visit: Payer: Self-pay

## 2022-02-02 MED ORDER — OZEMPIC (0.25 OR 0.5 MG/DOSE) 2 MG/1.5ML ~~LOC~~ SOPN: 0.5000 mg | PEN_INJECTOR | SUBCUTANEOUS | 3 refills | Status: DC

## 2022-02-02 MED ORDER — ATORVASTATIN CALCIUM 40 MG PO TABS: 40.0000 mg | ORAL_TABLET | Freq: Every day | ORAL | 11 refills | Status: DC

## 2022-02-02 MED ORDER — LISINOPRIL 5 MG PO TABS: 5.0000 mg | ORAL_TABLET | Freq: Every day | ORAL | 2 refills | Status: DC

## 2022-03-04 ENCOUNTER — Encounter: Payer: Self-pay | Admitting: Internal Medicine

## 2022-03-24 ENCOUNTER — Ambulatory Visit: Payer: BC Managed Care – PPO | Admitting: Internal Medicine

## 2022-03-31 ENCOUNTER — Ambulatory Visit: Payer: BC Managed Care – PPO | Admitting: Internal Medicine

## 2022-03-31 ENCOUNTER — Encounter: Payer: Self-pay | Admitting: Internal Medicine

## 2022-03-31 VITALS — BP 122/80 | HR 92 | Temp 98.2°F | Ht 75.0 in | Wt 319.2 lb

## 2022-03-31 DIAGNOSIS — E1169 Type 2 diabetes mellitus with other specified complication: Secondary | ICD-10-CM

## 2022-03-31 DIAGNOSIS — Z6839 Body mass index (BMI) 39.0-39.9, adult: Secondary | ICD-10-CM

## 2022-03-31 DIAGNOSIS — E785 Hyperlipidemia, unspecified: Secondary | ICD-10-CM

## 2022-03-31 DIAGNOSIS — Z79899 Other long term (current) drug therapy: Secondary | ICD-10-CM | POA: Diagnosis not present

## 2022-03-31 DIAGNOSIS — G44209 Tension-type headache, unspecified, not intractable: Secondary | ICD-10-CM | POA: Diagnosis not present

## 2022-03-31 DIAGNOSIS — I10 Essential (primary) hypertension: Secondary | ICD-10-CM | POA: Diagnosis not present

## 2022-03-31 DIAGNOSIS — R110 Nausea: Secondary | ICD-10-CM

## 2022-03-31 MED ORDER — CYCLOBENZAPRINE HCL 5 MG PO TABS: 5.0000 mg | ORAL_TABLET | Freq: Three times a day (TID) | ORAL | 0 refills | Status: DC | PRN

## 2022-03-31 NOTE — Progress Notes (Signed)
?Joel Wiley,acting as a Education administrator for Joel Greenland, MD.,have documented all relevant documentation on the behalf of Joel Greenland, MD,as directed by  Joel Greenland, MD while in the presence of Joel Greenland, MD.  ?This visit occurred during the SARS-CoV-2 public health emergency.  Safety protocols were in place, including screening questions prior to the visit, additional usage of staff PPE, and extensive cleaning of exam room while observing appropriate contact time as indicated for disinfecting solutions. ? ?Subjective:  ?  ? Patient ID: Joel Wiley , male    DOB: Apr 22, 1976 , 46 y.o.   MRN: [id number] ? ? ?Chief Complaint  ?Patient presents with  ? Diabetes  ? Hypertension  ? ? ?HPI ? ?He is here today for diabetes/HTN follow-up.  He reports compliance with meds. Patient complains of having more headaches recently. He is not sure what is triggered his headaches. He does admit to being under a lot of stress at work. Also with neck pain. He [facility], on computer all day.  ? ?Diabetes ?He presents for his follow-up diabetic visit. He has type 2 diabetes mellitus. His disease course has been improving. Hypoglycemia symptoms include headaches. Pertinent negatives for diabetes include no blurred vision. There are no hypoglycemic complications. Risk factors for coronary artery disease include diabetes mellitus, dyslipidemia, hypertension, obesity and male sex. He participates in exercise intermittently. His breakfast blood glucose is taken between 8-9 am. His breakfast blood glucose range is generally 110-130 mg/dl. Eye exam is not current.  ?Hypertension ?This is a chronic problem. The current episode started more than 1 year ago. The problem has been gradually improving since onset. The problem is controlled. Associated symptoms include headaches. Pertinent negatives include no blurred vision. The current treatment provides moderate improvement.   ? ?Past Medical History:  ?Diagnosis Date  ? Diabetes  ([place])   ? High cholesterol   ? Hypertension   ? Morbid obesity with BMI of 40.0-44.9, adult ([place])   ?  ? ?Family History  ?Problem Relation Age of Onset  ? Hyperlipidemia Mother   ? Hypertension Mother   ? Diabetes Mother   ? Stroke Mother   ? Heart disease Father   ?     heavy smoker  ? Hypertension Father   ? Hyperlipidemia Father   ? ? ? ?Current Outpatient Medications:  ?  aspirin EC 81 MG tablet, Take 81 mg by mouth daily., Disp: , Rfl:  ?  atorvastatin (LIPITOR) 40 MG tablet, Take 1 tablet (40 mg total) by mouth daily., Disp: 30 tablet, Rfl: 11 ?  cyclobenzaprine (FLEXERIL) 5 MG tablet, Take 1 tablet (5 mg total) by mouth 3 (three) times daily as needed for muscle spasms., Disp: 30 tablet, Rfl: 0 ?  lisinopril (ZESTRIL) 5 MG tablet, Take 1 tablet (5 mg total) by mouth daily., Disp: 90 tablet, Rfl: 2 ?  meloxicam (MOBIC) 15 MG tablet, Take 15 mg by mouth daily., Disp: , Rfl:  ?  metFORMIN (GLUCOPHAGE) 500 MG tablet, TAKE 1 TABLET BY MOUTH 2 TIMES DAILY WITH A MEAL., Disp: 180 tablet, Rfl: 2 ?  pravastatin (PRAVACHOL) 80 MG tablet, TAKE 1 TABLET BY MOUTH EVERY DAY, Disp: 90 tablet, Rfl: 1 ?  Semaglutide,0.25 or 0.5MG/DOS, (OZEMPIC, 0.25 OR 0.5 MG/DOSE,) 2 MG/1.5ML SOPN, Inject 0.5 mg into the skin once a week., Disp: 1.5 mL, Rfl: 3  ? ?No Known Allergies  ? ?Review of Systems  ?Constitutional: Negative.   ?Eyes:  Negative for blurred vision.  ?Respiratory: Negative.    ?  Cardiovascular: Negative.   ?Gastrointestinal:  Positive for nausea.  ?     He states he has been feeling nauseated for the past two months. Denies fever/chills. Denies increased belching. Sometimes relieved with bowel movement.   ?Neurological:  Positive for headaches.  ?     He c/o intermittent headaches. They are described as a dull ache. Denies associated visual disturbances. Located at backof his head and left side of his head.   ?Psychiatric/Behavioral: Negative.     ? ?Today's Vitals  ? 03/31/22 0831  ?BP: 122/80  ?Pulse: 92  ?Temp: 98.2  ?F (36.8 ?C)  ?Weight: (!) 319 lb 3.2 oz (144.8 kg)  ?Height: 6' 3" (1.905 m)  ?PainSc: 6   ?PainLoc: Shoulder  ? ?Body mass index is 39.9 kg/m?.  ?Wt Readings from Last 3 Encounters:  ?03/31/22 (!) 319 lb 3.2 oz (144.8 kg)  ?01/13/22 (!) 319 lb (144.7 kg)  ?12/17/21 (!) 318 lb 3.2 oz (144.3 kg)  ?  ? ?Objective:  ?Physical Exam ?Vitals and nursing note reviewed.  ?Constitutional:   ?   Appearance: Normal appearance. He is obese.  ?HENT:  ?   Head: Normocephalic and atraumatic.  ?Eyes:  ?   Extraocular Movements: Extraocular movements intact.  ?Cardiovascular:  ?   Rate and Rhythm: Normal rate and regular rhythm.  ?   Heart sounds: Normal heart sounds.  ?Pulmonary:  ?   Effort: Pulmonary effort is normal.  ?   Breath sounds: Normal breath sounds.  ?Musculoskeletal:  ?   Cervical back: Normal range of motion. Tenderness present.  ?Skin: ?   General: Skin is warm.  ?Neurological:  ?   General: No focal deficit present.  ?   Mental Status: He is alert.  ?Psychiatric:     ?   Mood and Affect: Mood normal.  ?   ?Assessment And Plan:  ?   ?1. Dyslipidemia associated with type 2 diabetes mellitus ([place]) ?Comments: Chronic, I will check labs as below. He will c/w atorvastatin. LDL goal <70. I will adjust meds as needed.  ?- CMP14+EGFR ?- Hemoglobin A1c ?- Amylase ?- Lipase ?- TSH ? ?2. Essential hypertension ?Comments: Chronic, well controlled. He is on Ace-inhibitor therapy.  ?- TSH ? ?3. Tension headache ?Comments: He would likely benefit from chiropractic/ART therapy. I will place referral if sx persist. I will send rx flexeril 20m nightly prn.  ? ?4. Nausea ?Comments: He is on GLP-1 therapy, I will check amylase/lipase levels. He is reminded to stop eating 3 hours prior to lying down and take meloxicam w/ food.  ?- CMP14+EGFR ?- Amylase ?- Lipase ? ?5. Class 2 severe obesity due to excess calories with serious comorbidity and body mass index (BMI) of 39.0 to 39.9 in adult ([facility] ?Comments: He is encouraged to aim for at  least 150 minutes of exercise per week, while initially striving for BMI<34 to decrease cardiac risk.  ? ?6. Drug therapy ?Comments: He has been on metformin for some time. I will check a b12 level today.  ?- Vitamin B12 ?  ? ?Patient was given opportunity to ask questions. Patient verbalized understanding of the plan and was able to repeat key elements of the plan. All questions were answered to their satisfaction.  ? ?I, RMaximino Greenland MD, have reviewed all documentation for this visit. The documentation on 03/31/22 for the exam, diagnosis, procedures, and orders are all accurate and complete.  ? ?IF YOU HAVE BEEN REFERRED TO A SPECIALIST, IT MAY TAKE 1-2  WEEKS TO SCHEDULE/PROCESS THE REFERRAL. IF YOU HAVE NOT HEARD FROM US/SPECIALIST IN TWO WEEKS, PLEASE GIVE [place] A CALL AT [phone].  ? ?THE PATIENT IS ENCOURAGED TO PRACTICE SOCIAL DISTANCING DUE TO THE COVID-19 PANDEMIC.   ?

## 2022-03-31 NOTE — Patient Instructions (Addendum)
[facility]  ?Dr. Lorelle Gibbs ? ?Diabetes Mellitus and Nutrition, Adult ?When you have diabetes, or diabetes mellitus, it is very important to have healthy eating habits because your blood sugar (glucose) levels are greatly affected by what you eat and drink. Eating healthy foods in the right amounts, at about the same times every day, can help you: ?Manage your blood glucose. ?Lower your risk of heart disease. ?Improve your blood pressure. ?Reach or maintain a healthy weight. ?What can affect my meal plan? ?Every person with diabetes is different, and each person has different needs for a meal plan. Your health care provider may recommend that you work with a dietitian to make a meal plan that is best for you. Your meal plan may vary depending on factors such as: ?The calories you need. ?The medicines you take. ?Your weight. ?Your blood glucose, blood pressure, and cholesterol levels. ?Your activity level. ?Other health conditions you have, such as heart or kidney disease. ?How do carbohydrates affect me? ?Carbohydrates, also called carbs, affect your blood glucose level more than any other type of food. Eating carbs raises the amount of glucose in your blood. ?It is important to know how many carbs you can safely have in each meal. This is different for every person. Your dietitian can help you calculate how many carbs you should have at each meal and for each snack. ?How does alcohol affect me? ?Alcohol can cause a decrease in blood glucose (hypoglycemia), especially if you use insulin or take certain diabetes medicines by mouth. Hypoglycemia can be a life-threatening condition. Symptoms of hypoglycemia, such as sleepiness, dizziness, and confusion, are similar to symptoms of having too much alcohol. ?Do not drink alcohol if: ?Your health care provider tells you not to drink. ?You are pregnant, may be pregnant, or are planning to become pregnant. ?If you drink alcohol: ?Limit how much you have to: ?0-1  drink a day for women. ?0-2 drinks a day for men. ?Know how much alcohol is in your drink. In the [place] one drink equals one 12 oz bottle of beer (355 mL), one 5 oz glass of wine (148 mL), or one 1? oz glass of hard liquor (44 mL). ?Keep yourself hydrated with water, diet soda, or unsweetened iced tea. Keep in mind that regular soda, juice, and other mixers may contain a lot of sugar and must be counted as carbs. ?What are tips for following this plan? ? ?Reading food labels ?Start by checking the serving size on the Nutrition Facts label of packaged foods and drinks. The number of calories and the amount of carbs, fats, and other nutrients listed on the label are based on one serving of the item. Many items contain more than one serving per package. ?Check the total grams (g) of carbs in one serving. ?Check the number of grams of saturated fats and trans fats in one serving. Choose foods that have a low amount or none of these fats. ?Check the number of milligrams (mg) of salt (sodium) in one serving. Most people should limit total sodium intake to less than 2,300 mg per day. ?Always check the nutrition information of foods labeled as "low-fat" or "nonfat." These foods may be higher in added sugar or refined carbs and should be avoided. ?Talk to your dietitian to identify your daily goals for nutrients listed on the label. ?Shopping ?Avoid buying canned, pre-made, or processed foods. These foods tend to be high in fat, sodium, and added sugar. ?Shop around the outside edge of  the grocery store. This is where you will most often find fresh fruits and vegetables, bulk grains, fresh meats, and fresh dairy products. ?Cooking ?Use low-heat cooking methods, such as baking, instead of high-heat cooking methods, such as deep frying. ?Cook using healthy oils, such as olive, canola, or sunflower oil. ?Avoid cooking with butter, cream, or high-fat meats. ?Meal planning ?Eat meals and snacks regularly, preferably at the same  times every day. Avoid going long periods of time without eating. ?Eat foods that are high in fiber, such as fresh fruits, vegetables, beans, and whole grains. ?Eat 4-6 oz (112-168 g) of lean protein each day, such as lean meat, chicken, fish, eggs, or tofu. One ounce (oz) (28 g) of lean protein is equal to: ?1 oz (28 g) of meat, chicken, or fish. ?1 egg. ?? cup (62 g) of tofu. ?Eat some foods each day that contain healthy fats, such as avocado, nuts, seeds, and fish. ?What foods should I eat? ?Fruits ?Berries. Apples. Oranges. Peaches. Apricots. Plums. Grapes. Mangoes. Papayas. Pomegranates. Kiwi. Cherries. ?Vegetables ?Leafy greens, including lettuce, spinach, kale, chard, collard greens, mustard greens, and cabbage. Beets. Cauliflower. Broccoli. Carrots. Green beans. Tomatoes. Peppers. Onions. Cucumbers. Brussels sprouts. ?Grains ?Whole grains, such as whole-wheat or whole-grain bread, crackers, tortillas, cereal, and pasta. Unsweetened oatmeal. Quinoa. Brown or wild rice. ?Meats and other proteins ?Seafood. Poultry without skin. Lean cuts of poultry and beef. Tofu. Nuts. Seeds. ?Dairy ?Low-fat or fat-free dairy products such as milk, yogurt, and cheese. ?The items listed above may not be a complete list of foods and beverages you can eat and drink. Contact a dietitian for more information. ?What foods should I avoid? ?Fruits ?Fruits canned with syrup. ?Vegetables ?Canned vegetables. Frozen vegetables with butter or cream sauce. ?Grains ?Refined white flour and flour products such as bread, pasta, snack foods, and cereals. Avoid all processed foods. ?Meats and other proteins ?Fatty cuts of meat. Poultry with skin. Breaded or fried meats. Processed meat. Avoid saturated fats. ?Dairy ?Full-fat yogurt, cheese, or milk. ?Beverages ?Sweetened drinks, such as soda or iced tea. ?The items listed above may not be a complete list of foods and beverages you should avoid. Contact a dietitian for more information. ?Questions  to ask a health care provider ?Do I need to meet with a certified diabetes care and education specialist? ?Do I need to meet with a dietitian? ?What number can I call if I have questions? ?When are the best times to check my blood glucose? ?Where to find more information: ?American Diabetes Association: diabetes.org ?Academy of Nutrition and Dietetics: eatright.org ?[employer] of Diabetes and Digestive and Kidney Diseases: StageSync.si ?Association of Diabetes Care & Education Specialists: diabeteseducator.org ?Summary ?It is important to have healthy eating habits because your blood sugar (glucose) levels are greatly affected by what you eat and drink. It is important to use alcohol carefully. ?A healthy meal plan will help you manage your blood glucose and lower your risk of heart disease. ?Your health care provider may recommend that you work with a dietitian to make a meal plan that is best for you. ?This information is not intended to replace advice given to you by your health care provider. Make sure you discuss any questions you have with your health care provider. ?Document Revised: 06/26/2020 Document Reviewed: 06/26/2020 ?Elsevier Patient Education ? 2023 Elsevier Inc. ? ?

## 2022-04-01 LAB — LIPASE: Lipase: 37 U/L (ref 13–78)

## 2022-04-01 LAB — CMP14+EGFR
ALT: 17 IU/L (ref 0–44)
AST: 18 IU/L (ref 0–40)
Albumin/Globulin Ratio: 1.8 (ref 1.2–2.2)
Albumin: 4.6 g/dL (ref 4.0–5.0)
Alkaline Phosphatase: 40 IU/L — ABNORMAL LOW (ref 44–121)
BUN/Creatinine Ratio: 15 (ref 9–20)
BUN: 16 mg/dL (ref 6–24)
Bilirubin Total: 0.6 mg/dL (ref 0.0–1.2)
CO2: 24 mmol/L (ref 20–29)
Calcium: 9.6 mg/dL (ref 8.7–10.2)
Chloride: 103 mmol/L (ref 96–106)
Creatinine, Ser: 1.1 mg/dL (ref 0.76–1.27)
Globulin, Total: 2.6 g/dL (ref 1.5–4.5)
Glucose: 108 mg/dL — ABNORMAL HIGH (ref 70–99)
Potassium: 4.2 mmol/L (ref 3.5–5.2)
Sodium: 140 mmol/L (ref 134–144)
Total Protein: 7.2 g/dL (ref 6.0–8.5)
eGFR: 83 mL/min/{1.73_m2} (ref >59)

## 2022-04-01 LAB — VITAMIN B12: Vitamin B-12: 1213 pg/mL (ref 232–1245)

## 2022-04-01 LAB — HEMOGLOBIN A1C
Est. average glucose Bld gHb Est-mCnc: 151 mg/dL
Hgb A1c MFr Bld: 6.9 % — ABNORMAL HIGH (ref 4.8–5.6)

## 2022-04-01 LAB — AMYLASE: Amylase: 85 U/L (ref 31–110)

## 2022-04-01 LAB — TSH: TSH: 1.21 u[IU]/mL (ref 0.450–4.500)

## 2022-05-11 ENCOUNTER — Encounter: Payer: Self-pay | Admitting: Internal Medicine

## 2022-05-18 DIAGNOSIS — M9902 Segmental and somatic dysfunction of thoracic region: Secondary | ICD-10-CM | POA: Diagnosis not present

## 2022-05-18 DIAGNOSIS — M9901 Segmental and somatic dysfunction of cervical region: Secondary | ICD-10-CM | POA: Diagnosis not present

## 2022-05-18 DIAGNOSIS — M5032 Other cervical disc degeneration, mid-cervical region, unspecified level: Secondary | ICD-10-CM | POA: Diagnosis not present

## 2022-05-18 DIAGNOSIS — R519 Headache, unspecified: Secondary | ICD-10-CM | POA: Diagnosis not present

## 2022-05-20 DIAGNOSIS — M9902 Segmental and somatic dysfunction of thoracic region: Secondary | ICD-10-CM | POA: Diagnosis not present

## 2022-05-20 DIAGNOSIS — M5032 Other cervical disc degeneration, mid-cervical region, unspecified level: Secondary | ICD-10-CM | POA: Diagnosis not present

## 2022-05-20 DIAGNOSIS — R519 Headache, unspecified: Secondary | ICD-10-CM | POA: Diagnosis not present

## 2022-05-20 DIAGNOSIS — M9901 Segmental and somatic dysfunction of cervical region: Secondary | ICD-10-CM | POA: Diagnosis not present

## 2022-05-22 ENCOUNTER — Encounter: Payer: Self-pay | Admitting: Internal Medicine

## 2022-06-01 DIAGNOSIS — M9902 Segmental and somatic dysfunction of thoracic region: Secondary | ICD-10-CM | POA: Diagnosis not present

## 2022-06-01 DIAGNOSIS — M9901 Segmental and somatic dysfunction of cervical region: Secondary | ICD-10-CM | POA: Diagnosis not present

## 2022-06-01 DIAGNOSIS — M5032 Other cervical disc degeneration, mid-cervical region, unspecified level: Secondary | ICD-10-CM | POA: Diagnosis not present

## 2022-06-01 DIAGNOSIS — R519 Headache, unspecified: Secondary | ICD-10-CM | POA: Diagnosis not present

## 2022-06-03 DIAGNOSIS — M5032 Other cervical disc degeneration, mid-cervical region, unspecified level: Secondary | ICD-10-CM | POA: Diagnosis not present

## 2022-06-03 DIAGNOSIS — R519 Headache, unspecified: Secondary | ICD-10-CM | POA: Diagnosis not present

## 2022-06-03 DIAGNOSIS — M9902 Segmental and somatic dysfunction of thoracic region: Secondary | ICD-10-CM | POA: Diagnosis not present

## 2022-06-03 DIAGNOSIS — M9901 Segmental and somatic dysfunction of cervical region: Secondary | ICD-10-CM | POA: Diagnosis not present

## 2022-06-08 DIAGNOSIS — M5032 Other cervical disc degeneration, mid-cervical region, unspecified level: Secondary | ICD-10-CM | POA: Diagnosis not present

## 2022-06-08 DIAGNOSIS — M9902 Segmental and somatic dysfunction of thoracic region: Secondary | ICD-10-CM | POA: Diagnosis not present

## 2022-06-08 DIAGNOSIS — M9901 Segmental and somatic dysfunction of cervical region: Secondary | ICD-10-CM | POA: Diagnosis not present

## 2022-06-08 DIAGNOSIS — R519 Headache, unspecified: Secondary | ICD-10-CM | POA: Diagnosis not present

## 2022-06-10 DIAGNOSIS — R519 Headache, unspecified: Secondary | ICD-10-CM | POA: Diagnosis not present

## 2022-06-10 DIAGNOSIS — M5032 Other cervical disc degeneration, mid-cervical region, unspecified level: Secondary | ICD-10-CM | POA: Diagnosis not present

## 2022-06-10 DIAGNOSIS — M9902 Segmental and somatic dysfunction of thoracic region: Secondary | ICD-10-CM | POA: Diagnosis not present

## 2022-06-10 DIAGNOSIS — M9901 Segmental and somatic dysfunction of cervical region: Secondary | ICD-10-CM | POA: Diagnosis not present

## 2022-06-17 DIAGNOSIS — M9902 Segmental and somatic dysfunction of thoracic region: Secondary | ICD-10-CM | POA: Diagnosis not present

## 2022-06-17 DIAGNOSIS — M5032 Other cervical disc degeneration, mid-cervical region, unspecified level: Secondary | ICD-10-CM | POA: Diagnosis not present

## 2022-06-17 DIAGNOSIS — R519 Headache, unspecified: Secondary | ICD-10-CM | POA: Diagnosis not present

## 2022-06-17 DIAGNOSIS — M9901 Segmental and somatic dysfunction of cervical region: Secondary | ICD-10-CM | POA: Diagnosis not present

## 2022-06-22 DIAGNOSIS — R519 Headache, unspecified: Secondary | ICD-10-CM | POA: Diagnosis not present

## 2022-06-22 DIAGNOSIS — M5032 Other cervical disc degeneration, mid-cervical region, unspecified level: Secondary | ICD-10-CM | POA: Diagnosis not present

## 2022-06-22 DIAGNOSIS — M9902 Segmental and somatic dysfunction of thoracic region: Secondary | ICD-10-CM | POA: Diagnosis not present

## 2022-06-22 DIAGNOSIS — M9901 Segmental and somatic dysfunction of cervical region: Secondary | ICD-10-CM | POA: Diagnosis not present

## 2022-06-29 DIAGNOSIS — M5032 Other cervical disc degeneration, mid-cervical region, unspecified level: Secondary | ICD-10-CM | POA: Diagnosis not present

## 2022-06-29 DIAGNOSIS — M9901 Segmental and somatic dysfunction of cervical region: Secondary | ICD-10-CM | POA: Diagnosis not present

## 2022-06-29 DIAGNOSIS — R519 Headache, unspecified: Secondary | ICD-10-CM | POA: Diagnosis not present

## 2022-06-29 DIAGNOSIS — M9902 Segmental and somatic dysfunction of thoracic region: Secondary | ICD-10-CM | POA: Diagnosis not present

## 2022-07-07 ENCOUNTER — Encounter: Payer: Self-pay | Admitting: Internal Medicine

## 2022-07-07 ENCOUNTER — Ambulatory Visit (INDEPENDENT_AMBULATORY_CARE_PROVIDER_SITE_OTHER): Payer: BC Managed Care – PPO | Admitting: Internal Medicine

## 2022-07-07 VITALS — BP 124/80 | HR 94 | Temp 97.7°F | Ht 75.0 in | Wt 311.0 lb

## 2022-07-07 DIAGNOSIS — E1169 Type 2 diabetes mellitus with other specified complication: Secondary | ICD-10-CM | POA: Diagnosis not present

## 2022-07-07 DIAGNOSIS — Z6838 Body mass index (BMI) 38.0-38.9, adult: Secondary | ICD-10-CM

## 2022-07-07 DIAGNOSIS — I10 Essential (primary) hypertension: Secondary | ICD-10-CM | POA: Diagnosis not present

## 2022-07-07 DIAGNOSIS — E785 Hyperlipidemia, unspecified: Secondary | ICD-10-CM

## 2022-07-07 MED ORDER — SEMAGLUTIDE (1 MG/DOSE) 4 MG/3ML ~~LOC~~ SOPN
1.0000 mg | PEN_INJECTOR | SUBCUTANEOUS | 3 refills | Status: DC
Start: 2022-07-07 — End: 2022-10-13

## 2022-07-07 NOTE — Progress Notes (Signed)
Joel Wiley,acting as a Education administrator for Joel Greenland, MD.,have documented all relevant documentation on the behalf of Joel Greenland, MD,as directed by  Joel Greenland, MD while in the presence of Joel Greenland, MD.    Subjective:     Patient ID: Joel Wiley , male    DOB: [birth year] , 46 y.o.   MRN: [id number]   Chief Complaint  Patient presents with   Diabetes   Hypertension    HPI  Patient presents today for a DM check, patient is complaint with all medications. He states his sugars are 120-130 in the mornings. Patient has no other concerns today.     Diabetes He presents for his follow-up diabetic visit. He has type 2 diabetes mellitus. His disease course has been improving. There are no hypoglycemic associated symptoms. Pertinent negatives for diabetes include no blurred vision. There are no hypoglycemic complications. Risk factors for coronary artery disease include diabetes mellitus, dyslipidemia, hypertension, obesity and male sex. He participates in exercise intermittently. His breakfast blood glucose is taken between 8-9 am. His breakfast blood glucose range is generally 110-130 mg/dl. Eye exam is not current.  Hypertension This is a chronic problem. The current episode started more than 1 year ago. The problem has been gradually improving since onset. The problem is controlled. Pertinent negatives include no blurred vision. The current treatment provides moderate improvement.     Past Medical History:  Diagnosis Date   Diabetes ([place])    High cholesterol    Hypertension    Morbid obesity with BMI of 40.0-44.9, adult ([place])      Family History  Problem Relation Age of Onset   Hyperlipidemia Mother    Hypertension Mother    Diabetes Mother    Stroke Mother    Heart disease Father        heavy smoker   Hypertension Father    Hyperlipidemia Father      Current Outpatient Medications:    aspirin EC 81 MG tablet, Take 81 mg by mouth daily., Disp: , Rfl:     atorvastatin (LIPITOR) 40 MG tablet, Take 1 tablet (40 mg total) by mouth daily., Disp: 30 tablet, Rfl: 11   cyclobenzaprine (FLEXERIL) 5 MG tablet, Take 1 tablet (5 mg total) by mouth 3 (three) times daily as needed for muscle spasms., Disp: 30 tablet, Rfl: 0   lisinopril (ZESTRIL) 5 MG tablet, Take 1 tablet (5 mg total) by mouth daily., Disp: 90 tablet, Rfl: 2   meloxicam (MOBIC) 15 MG tablet, Take 15 mg by mouth daily., Disp: , Rfl:    metFORMIN (GLUCOPHAGE) 500 MG tablet, TAKE 1 TABLET BY MOUTH 2 TIMES DAILY WITH A MEAL., Disp: 180 tablet, Rfl: 2   Semaglutide, 1 MG/DOSE, 4 MG/3ML SOPN, Inject 1 mg into the skin once a week., Disp: 3 mL, Rfl: 3   No Known Allergies   Review of Systems  Constitutional: Negative.   HENT: Negative.    Eyes: Negative.  Negative for blurred vision.  Respiratory: Negative.    Cardiovascular: Negative.   Gastrointestinal: Negative.   Psychiatric/Behavioral: Negative.       Today's Vitals   07/07/22 0829  BP: 124/80  Pulse: 94  Temp: 97.7 F (36.5 C)  Weight: (!) 311 lb (141.1 kg)  Height: 6' 3"  (1.905 m)  PainSc: 0-No pain   Body mass index is 38.87 kg/m.   Wt Readings from Last 3 Encounters:  07/07/22 (!) 311 lb (141.1 kg)  03/31/22 (!) 319 lb  3.2 oz (144.8 kg)  01/13/22 (!) 319 lb (144.7 kg)     Objective:  Physical Exam Vitals and nursing note reviewed.  Constitutional:      Appearance: Normal appearance. He is obese.  HENT:     Head: Normocephalic and atraumatic.  Eyes:     Extraocular Movements: Extraocular movements intact.  Cardiovascular:     Rate and Rhythm: Normal rate and regular rhythm.     Heart sounds: Normal heart sounds.  Pulmonary:     Effort: Pulmonary effort is normal.     Breath sounds: Normal breath sounds.  Musculoskeletal:     Cervical back: Normal range of motion.  Skin:    General: Skin is warm.  Neurological:     General: No focal deficit present.     Mental Status: He is alert.  Psychiatric:         Mood and Affect: Mood normal.      Assessment And Plan:     1. Dyslipidemia associated with type 2 diabetes mellitus ([place]) Comments: Chronic, goal LDL <70. I will increase Ozempic to 35m weekly.  Unfortunately, he did not stop the pravastatin when he started the atorvastatin. He verbally acknowledges he needs to immediately stop the pravastatin.  - Hemoglobin A1c - CMP14+EGFR - Lipid panel  2. Essential hypertension Comments: Chronic, well controlled. He will c/w lisinopril daily.   3. Class 2 severe obesity due to excess calories with serious comorbidity and body mass index (BMI) of 38.0 to 38.9 in adult ([facility] Comments: He was congratulated on his 8lb weight loss and encouraged to keep up the great work!   Patient was given opportunity to ask questions. Patient verbalized understanding of the plan and was able to repeat key elements of the plan. All questions were answered to their satisfaction.   I, RMaximino Greenland MD, have reviewed all documentation for this visit. The documentation on 07/07/22 for the exam, diagnosis, procedures, and orders are all accurate and complete.   IF YOU HAVE BEEN REFERRED TO A SPECIALIST, IT MAY TAKE 1-2 WEEKS TO SCHEDULE/PROCESS THE REFERRAL. IF YOU HAVE NOT HEARD FROM US/SPECIALIST IN TWO WEEKS, PLEASE GIVE [place] CALL AT [phone].   THE PATIENT IS ENCOURAGED TO PRACTICE SOCIAL DISTANCING DUE TO THE COVID-19 PANDEMIC.

## 2022-07-07 NOTE — Patient Instructions (Signed)

## 2022-07-08 LAB — LIPID PANEL
Chol/HDL Ratio: 3.2 ratio (ref 0.0–5.0)
Cholesterol, Total: 143 mg/dL (ref 100–199)
HDL: 45 mg/dL (ref >39)
LDL Chol Calc (NIH): 83 mg/dL (ref 0–99)
Triglycerides: 78 mg/dL (ref 0–149)
VLDL Cholesterol Cal: 15 mg/dL (ref 5–40)

## 2022-07-08 LAB — CMP14+EGFR
ALT: 25 IU/L (ref 0–44)
AST: 20 IU/L (ref 0–40)
Albumin/Globulin Ratio: 1.4 (ref 1.2–2.2)
Albumin: 4.3 g/dL (ref 4.1–5.1)
Alkaline Phosphatase: 38 IU/L — ABNORMAL LOW (ref 44–121)
BUN/Creatinine Ratio: 13 (ref 9–20)
BUN: 14 mg/dL (ref 6–24)
Bilirubin Total: 0.6 mg/dL (ref 0.0–1.2)
CO2: 23 mmol/L (ref 20–29)
Calcium: 9.3 mg/dL (ref 8.7–10.2)
Chloride: 102 mmol/L (ref 96–106)
Creatinine, Ser: 1.05 mg/dL (ref 0.76–1.27)
Globulin, Total: 3 g/dL (ref 1.5–4.5)
Glucose: 108 mg/dL — ABNORMAL HIGH (ref 70–99)
Potassium: 4.1 mmol/L (ref 3.5–5.2)
Sodium: 138 mmol/L (ref 134–144)
Total Protein: 7.3 g/dL (ref 6.0–8.5)
eGFR: 88 mL/min/{1.73_m2} (ref >59)

## 2022-07-08 LAB — HEMOGLOBIN A1C
Est. average glucose Bld gHb Est-mCnc: 151 mg/dL
Hgb A1c MFr Bld: 6.9 % — ABNORMAL HIGH (ref 4.8–5.6)

## 2022-07-09 ENCOUNTER — Ambulatory Visit: Payer: BC Managed Care – PPO | Admitting: Internal Medicine

## 2022-08-06 ENCOUNTER — Other Ambulatory Visit: Payer: Self-pay | Admitting: Internal Medicine

## 2022-08-14 ENCOUNTER — Ambulatory Visit
Admission: EM | Admit: 2022-08-14 | Discharge: 2022-08-14 | Disposition: A | Discharge status: Home / Self Care | Payer: BC Managed Care – PPO | Attending: Emergency Medicine | Admitting: Emergency Medicine

## 2022-08-14 ENCOUNTER — Encounter: Payer: Self-pay | Admitting: Internal Medicine

## 2022-08-14 DIAGNOSIS — R0602 Shortness of breath: Secondary | ICD-10-CM | POA: Diagnosis not present

## 2022-08-14 DIAGNOSIS — I872 Venous insufficiency (chronic) (peripheral): Secondary | ICD-10-CM | POA: Insufficient documentation

## 2022-08-14 DIAGNOSIS — E785 Hyperlipidemia, unspecified: Secondary | ICD-10-CM | POA: Insufficient documentation

## 2022-08-14 DIAGNOSIS — R5383 Other fatigue: Secondary | ICD-10-CM | POA: Diagnosis not present

## 2022-08-14 DIAGNOSIS — I10 Essential (primary) hypertension: Secondary | ICD-10-CM | POA: Insufficient documentation

## 2022-08-14 DIAGNOSIS — E119 Type 2 diabetes mellitus without complications: Secondary | ICD-10-CM | POA: Diagnosis not present

## 2022-08-14 DIAGNOSIS — Z20822 Contact with and (suspected) exposure to covid-19: Secondary | ICD-10-CM | POA: Diagnosis not present

## 2022-08-14 DIAGNOSIS — Z6839 Body mass index (BMI) 39.0-39.9, adult: Secondary | ICD-10-CM | POA: Diagnosis not present

## 2022-08-14 LAB — POCT FASTING CBG KUC MANUAL ENTRY: POCT Glucose (KUC): 128 mg/dL — AB (ref 70–99)

## 2022-08-14 LAB — SARS CORONAVIRUS 2 (TAT 6-24 HRS): SARS Coronavirus 2: NEGATIVE

## 2022-08-14 NOTE — ED Provider Notes (Signed)
[facility]    CSN: [id number] Arrival date & time: 08/14/22  1038      History   Chief Complaint Chief Complaint  Patient presents with   Fatigue   Shortness of Breath    HPI Joel Wiley is a 46 y.o. male.  Patient presents with shortness of breath and fatigue since this morning.  He was on a conference call for work and felt short of breath as he was talking.  He had a second work call after this and felt short of breath again while talking.  He feels fatigued and tired today.  He denies focal weakness, numbness, headache, dizziness, chest pain, fever, chills, ear pain, sore throat, cough vomiting, diarrhea, or other symptoms.  No treatments at home.  He denies history of lung disease.  His medical history includes hypertension, hyperlipidemia, diabetes, chronic venous insufficiency, morbid obesity.  The history is provided by the patient and medical records.    Past Medical History:  Diagnosis Date   Diabetes (HCC)    High cholesterol    Hypertension    Morbid obesity with BMI of 40.0-44.9, adult [employer])     Patient Active Problem List   Diagnosis Date Noted   Tension headache 03/31/2022   Class 2 severe obesity due to excess calories with serious comorbidity and body mass index (BMI) of 39.0 to 39.9 in adult (HCC) 03/31/2022   Nausea 03/31/2022   Chronic venous insufficiency 01/13/2022   Skin fissures 12/26/2021   Impingement syndrome of shoulder region 11/13/2019   Neck pain 11/13/2019   Positive for microalbuminuria 10/07/2019   Uncontrolled type 2 diabetes mellitus with hyperglycemia (HCC) 02/22/2019   Diabetes (HCC)    Essential hypertension    High cholesterol     Past Surgical History:  Procedure Laterality Date   WISDOM TOOTH EXTRACTION         Home Medications    Prior to Admission medications   Medication Sig Start Date End Date Taking? Authorizing Provider  meloxicam (MOBIC) 15 MG tablet Take 1 tablet by mouth daily. 05/20/22  Yes  [provider]  aspirin EC 81 MG tablet Take 81 mg by mouth daily.    [provider]  atorvastatin (LIPITOR) 40 MG tablet Take 1 tablet (40 mg total) by mouth daily. 02/02/22 02/02/23  Dorothyann Peng, MD  cyclobenzaprine (FLEXERIL) 5 MG tablet Take 1 tablet (5 mg total) by mouth 3 (three) times daily as needed for muscle spasms. 03/31/22   Dorothyann Peng, MD  lisinopril (ZESTRIL) 5 MG tablet Take 1 tablet (5 mg total) by mouth daily. 02/02/22   Dorothyann Peng, MD  meloxicam (MOBIC) 15 MG tablet Take 15 mg by mouth daily. 12/21/21   [provider]  metFORMIN (GLUCOPHAGE) 500 MG tablet TAKE 1 TABLET BY MOUTH 2 TIMES DAILY WITH A MEAL. 08/06/22   Dorothyann Peng, MD  Semaglutide, 1 MG/DOSE, 4 MG/3ML SOPN Inject 1 mg into the skin once a week. 07/07/22   Dorothyann Peng, MD    Family History Family History  Problem Relation Age of Onset   Hyperlipidemia Mother    Hypertension Mother    Diabetes Mother    Stroke Mother    Heart disease Father        heavy smoker   Hypertension Father    Hyperlipidemia Father     Social History Social History   Tobacco Use   Smoking status: Never   Smokeless tobacco: Never  Vaping Use   Vaping Use: Never used  Substance Use Topics   Alcohol use: Yes    Comment: occasionally    Drug use: Never     Allergies   Patient has no known allergies.   Review of Systems Review of Systems  Constitutional:  Positive for fatigue. Negative for chills and fever.  HENT:  Negative for ear pain and sore throat.   Respiratory:  Positive for shortness of breath. Negative for cough and chest tightness.   Cardiovascular:  Negative for chest pain and palpitations.  Gastrointestinal:  Negative for abdominal pain, diarrhea and vomiting.  Genitourinary:  Negative for dysuria and hematuria.  Skin:  Negative for color change and rash.  Neurological:  Negative for dizziness, syncope, facial asymmetry, speech difficulty, weakness, light-headedness,  numbness and headaches.  All other systems reviewed and are negative.    Physical Exam Triage Vital Signs ED Triage Vitals  Enc Vitals Group     BP      Pulse      Resp      Temp      Temp src      SpO2      Weight      Height      Head Circumference      Peak Flow      Pain Score      Pain Loc      Pain Edu?      Excl. in GC?    No data found.  Updated Vital Signs BP 126/86   Pulse 100   Temp 97.9 F (36.6 C)   Resp 18   Ht 6\' 3"  (1.905 m)   Wt (!) 312 lb (141.5 kg)   SpO2 98%   BMI 39.00 kg/m   Visual Acuity Right Eye Distance:   Left Eye Distance:   Bilateral Distance:    Right Eye Near:   Left Eye Near:    Bilateral Near:     Physical Exam Vitals and nursing note reviewed.  Constitutional:      General: He is not in acute distress.    Appearance: He is well-developed. He is obese. He is not ill-appearing.  HENT:     Right Ear: Tympanic membrane normal.     Left Ear: Tympanic membrane normal.     Nose: Nose normal.     Mouth/Throat:     Mouth: Mucous membranes are moist.     Pharynx: Oropharynx is clear.  Cardiovascular:     Rate and Rhythm: Normal rate and regular rhythm.     Heart sounds: Normal heart sounds.  Pulmonary:     Effort: Pulmonary effort is normal. No respiratory distress.     Breath sounds: Normal breath sounds. No wheezing, rhonchi or rales.     Comments: No respiratory distress.  Lungs are clear.  O2 sat 98% on room air. Musculoskeletal:     Cervical back: Neck supple.     Right lower leg: No edema.     Left lower leg: No edema.  Skin:    General: Skin is warm and dry.  Neurological:     General: No focal deficit present.     Mental Status: He is alert and oriented to person, place, and time.     Sensory: No sensory deficit.     Motor: No weakness.     Gait: Gait normal.  Psychiatric:        Mood and Affect: Mood normal.        Behavior: Behavior normal.  UC Treatments / Results  Labs (all labs ordered are  listed, but only abnormal results are displayed) Labs Reviewed  POCT FASTING CBG [facility] MANUAL ENTRY - Abnormal; Notable for the following components:      Result Value   POCT Glucose ([facility]) 128 (*)    All other components within normal limits  SARS CORONAVIRUS 2 (TAT 6-24 HRS)    EKG   Radiology No results found.  Procedures Procedures (including critical care time)  Medications Ordered in UC Medications - No data to display  Initial Impression / Assessment and Plan / UC Course  I have reviewed the triage vital signs and the nursing notes.  Pertinent labs & imaging results that were available during my care of the patient were reviewed by me and considered in my medical decision making (see chart for details).   Shortness of breath, fatigue.  Patient is well-appearing and his exam is reassuring.  No respiratory distress, lungs are clear, O2 sat 98% on room air.  EKG shows sinus rhythm, rate 89, no ST elevation, compared to previous from 12/17/2021.  CBG 128.  COVID pending.  Discussed symptomatic treatment including rest and hydration.  ED precautions discussed.  Instructed patient to follow-up with his PCP on Monday.  Education provided on shortness of breath and fatigue.  Patient agrees to plan of care.   Final Clinical Impressions(s) / UC Diagnoses   Final diagnoses:  Shortness of breath  Fatigue, unspecified type     Discharge Instructions      Go to the emergency department if you have persistent or worsening symptoms.    Follow-up with your primary care provider on Monday.         ED Prescriptions   None    PDMP not reviewed this encounter.   Mickie Bail, NP 08/14/22 1115

## 2022-08-14 NOTE — Discharge Instructions (Addendum)
Go to the emergency department if you have persistent or worsening symptoms.  Follow-up with your primary care provider on Monday.     

## 2022-08-14 NOTE — ED Triage Notes (Signed)
Patient to Urgent Care with complaints of some shortness of breath and fatigue today.   Describes feeling sluggish and unusually tired. Reports when he is working/ talking he feels like he has to take a deep breath and gets easily winded.  Denies any chest pain. Denies any cough. No known sick contacts.

## 2022-08-25 ENCOUNTER — Encounter: Payer: Self-pay | Admitting: Internal Medicine

## 2022-08-25 ENCOUNTER — Other Ambulatory Visit: Payer: Self-pay

## 2022-08-25 MED ORDER — OZEMPIC (2 MG/DOSE) 8 MG/3ML ~~LOC~~ SOPN: 2.0000 mg | PEN_INJECTOR | SUBCUTANEOUS | 0 refills | Status: DC

## 2022-09-29 ENCOUNTER — Encounter: Payer: Self-pay | Admitting: Internal Medicine

## 2022-10-13 ENCOUNTER — Encounter: Payer: Self-pay | Admitting: Internal Medicine

## 2022-10-13 ENCOUNTER — Ambulatory Visit: Payer: BC Managed Care – PPO | Admitting: Internal Medicine

## 2022-10-13 VITALS — BP 120/84 | HR 86 | Temp 97.7°F | Ht 72.0 in | Wt 319.2 lb

## 2022-10-13 DIAGNOSIS — E785 Hyperlipidemia, unspecified: Secondary | ICD-10-CM | POA: Diagnosis not present

## 2022-10-13 DIAGNOSIS — E559 Vitamin D deficiency, unspecified: Secondary | ICD-10-CM | POA: Diagnosis not present

## 2022-10-13 DIAGNOSIS — E1169 Type 2 diabetes mellitus with other specified complication: Secondary | ICD-10-CM | POA: Diagnosis not present

## 2022-10-13 DIAGNOSIS — Z6841 Body Mass Index (BMI) 40.0 and over, adult: Secondary | ICD-10-CM

## 2022-10-13 DIAGNOSIS — I10 Essential (primary) hypertension: Secondary | ICD-10-CM

## 2022-10-13 NOTE — Patient Instructions (Addendum)
Start 54 clicks Ozempic (1.5mg ) next Monday, x 2 weeks Xigduo  - medication in combo w/ metformin to provide cardiac/renal protection   Diabetes Mellitus and Nutrition, Adult When you have diabetes, or diabetes mellitus, it is very important to have healthy eating habits because your blood sugar (glucose) levels are greatly affected by what you eat and drink. Eating healthy foods in the right amounts, at about the same times every day, can help you: Manage your blood glucose. Lower your risk of heart disease. Improve your blood pressure. Reach or maintain a healthy weight. What can affect my meal plan? Every person with diabetes is different, and each person has different needs for a meal plan. Your health care provider may recommend that you work with a dietitian to make a meal plan that is best for you. Your meal plan may vary depending on factors such as: The calories you need. The medicines you take. Your weight. Your blood glucose, blood pressure, and cholesterol levels. Your activity level. Other health conditions you have, such as heart or kidney disease. How do carbohydrates affect me? Carbohydrates, also called carbs, affect your blood glucose level more than any other type of food. Eating carbs raises the amount of glucose in your blood. It is important to know how many carbs you can safely have in each meal. This is different for every person. Your dietitian can help you calculate how many carbs you should have at each meal and for each snack. How does alcohol affect me? Alcohol can cause a decrease in blood glucose (hypoglycemia), especially if you use insulin or take certain diabetes medicines by mouth. Hypoglycemia can be a life-threatening condition. Symptoms of hypoglycemia, such as sleepiness, dizziness, and confusion, are similar to symptoms of having too much alcohol. Do not drink alcohol if: Your health care provider tells you not to drink. You are pregnant, may be  pregnant, or are planning to become pregnant. If you drink alcohol: Limit how much you have to: 0-1 drink a day for women. 0-2 drinks a day for men. Know how much alcohol is in your drink. In the [place] one drink equals one 12 oz bottle of beer (355 mL), one 5 oz glass of wine (148 mL), or one 1 oz glass of hard liquor (44 mL). Keep yourself hydrated with water, diet soda, or unsweetened iced tea. Keep in mind that regular soda, juice, and other mixers may contain a lot of sugar and must be counted as carbs. What are tips for following this plan?  Reading food labels Start by checking the serving size on the Nutrition Facts label of packaged foods and drinks. The number of calories and the amount of carbs, fats, and other nutrients listed on the label are based on one serving of the item. Many items contain more than one serving per package. Check the total grams (g) of carbs in one serving. Check the number of grams of saturated fats and trans fats in one serving. Choose foods that have a low amount or none of these fats. Check the number of milligrams (mg) of salt (sodium) in one serving. Most people should limit total sodium intake to less than 2,300 mg per day. Always check the nutrition information of foods labeled as "low-fat" or "nonfat." These foods may be higher in added sugar or refined carbs and should be avoided. Talk to your dietitian to identify your daily goals for nutrients listed on the label. Shopping Avoid buying canned, pre-made, or processed foods. These  foods tend to be high in fat, sodium, and added sugar. Shop around the outside edge of the grocery store. This is where you will most often find fresh fruits and vegetables, bulk grains, fresh meats, and fresh dairy products. Cooking Use low-heat cooking methods, such as baking, instead of high-heat cooking methods, such as deep frying. Cook using healthy oils, such as olive, canola, or sunflower oil. Avoid cooking with  butter, cream, or high-fat meats. Meal planning Eat meals and snacks regularly, preferably at the same times every day. Avoid going long periods of time without eating. Eat foods that are high in fiber, such as fresh fruits, vegetables, beans, and whole grains. Eat 4-6 oz (112-168 g) of lean protein each day, such as lean meat, chicken, fish, eggs, or tofu. One ounce (oz) (28 g) of lean protein is equal to: 1 oz (28 g) of meat, chicken, or fish. 1 egg.  cup (62 g) of tofu. Eat some foods each day that contain healthy fats, such as avocado, nuts, seeds, and fish. What foods should I eat? Fruits Berries. Apples. Oranges. Peaches. Apricots. Plums. Grapes. Mangoes. Papayas. Pomegranates. Kiwi. Cherries. Vegetables Leafy greens, including lettuce, spinach, kale, chard, collard greens, mustard greens, and cabbage. Beets. Cauliflower. Broccoli. Carrots. Green beans. Tomatoes. Peppers. Onions. Cucumbers. Brussels sprouts. Grains Whole grains, such as whole-wheat or whole-grain bread, crackers, tortillas, cereal, and pasta. Unsweetened oatmeal. Quinoa. Brown or wild rice. Meats and other proteins Seafood. Poultry without skin. Lean cuts of poultry and beef. Tofu. Nuts. Seeds. Dairy Low-fat or fat-free dairy products such as milk, yogurt, and cheese. The items listed above may not be a complete list of foods and beverages you can eat and drink. Contact a dietitian for more information. What foods should I avoid? Fruits Fruits canned with syrup. Vegetables Canned vegetables. Frozen vegetables with butter or cream sauce. Grains Refined white flour and flour products such as bread, pasta, snack foods, and cereals. Avoid all processed foods. Meats and other proteins Fatty cuts of meat. Poultry with skin. Breaded or fried meats. Processed meat. Avoid saturated fats. Dairy Full-fat yogurt, cheese, or milk. Beverages Sweetened drinks, such as soda or iced tea. The items listed above may not be a  complete list of foods and beverages you should avoid. Contact a dietitian for more information. Questions to ask a health care provider Do I need to meet with a certified diabetes care and education specialist? Do I need to meet with a dietitian? What number can I call if I have questions? When are the best times to check my blood glucose? Where to find more information: American Diabetes Association: diabetes.org Academy of Nutrition and Dietetics: eatright.[employer] of Diabetes and Digestive and Kidney Diseases: [employer] Association of Diabetes Care & Education Specialists: diabeteseducator.org Summary It is important to have healthy eating habits because your blood sugar (glucose) levels are greatly affected by what you eat and drink. It is important to use alcohol carefully. A healthy meal plan will help you manage your blood glucose and lower your risk of heart disease. Your health care provider may recommend that you work with a dietitian to make a meal plan that is best for you. This information is not intended to replace advice given to you by your health care provider. Make sure you discuss any questions you have with your health care provider. Document Revised: 06/26/2020 Document Reviewed: 06/26/2020 Elsevier Patient Education  [place].

## 2022-10-13 NOTE — Progress Notes (Signed)
[name],acting as a Education administrator for [name], MD.,have documented all relevant documentation on the behalf of [name], MD,as directed by  [name], MD while in the presence of [name], MD.    Subjective:     Patient ID: Joel Wiley , male    DOB: Oct 14, 1976 , 46 y.o.   MRN: [id number]   Chief Complaint  Patient presents with   Diabetes   Hypertension    HPI  Patient presents today for a DM and Bp  check, Patient reports complaince with medications and has no other concerns at this time. He states his sugars range from 115-180.   Diabetes He presents for his follow-up diabetic visit. He has type 2 diabetes mellitus. His disease course has been improving. Pertinent negatives for diabetes include no blurred vision, no polydipsia, no polyphagia and no polyuria. There are no hypoglycemic complications. Risk factors for coronary artery disease include diabetes mellitus, dyslipidemia, hypertension, obesity and male sex. He participates in exercise intermittently. His breakfast blood glucose is taken between 8-9 am. His breakfast blood glucose range is generally 110-130 mg/dl. Eye exam is not current.  Hypertension This is a chronic problem. The current episode started more than 1 year ago. The problem has been gradually improving since onset. The problem is controlled. Pertinent negatives include no blurred vision. The current treatment provides moderate improvement.     Past Medical History:  Diagnosis Date   Diabetes ([place])    High cholesterol    Hypertension    Morbid obesity with BMI of 40.0-44.9, adult ([place])      Family History  Problem Relation Age of Onset   Hyperlipidemia Mother    Hypertension Mother    Diabetes Mother    Stroke Mother    Heart disease Father        heavy smoker   Hypertension Father    Hyperlipidemia Father      Current Outpatient Medications:    aspirin EC 81 MG tablet, Take 81 mg by mouth daily., Disp: , Rfl:     atorvastatin (LIPITOR) 40 MG tablet, Take 1 tablet (40 mg total) by mouth daily., Disp: 30 tablet, Rfl: 11   lisinopril (ZESTRIL) 5 MG tablet, Take 1 tablet (5 mg total) by mouth daily., Disp: 90 tablet, Rfl: 2   meloxicam (MOBIC) 15 MG tablet, Take 1 tablet by mouth daily., Disp: , Rfl:    metFORMIN (GLUCOPHAGE) 500 MG tablet, TAKE 1 TABLET BY MOUTH 2 TIMES DAILY WITH A MEAL., Disp: 180 tablet, Rfl: 2   Semaglutide, 2 MG/DOSE, (OZEMPIC, 2 MG/DOSE,) 8 MG/3ML SOPN, Inject 2 mg into the skin once a week., Disp: 9 mL, Rfl: 0   No Known Allergies   Review of Systems  Constitutional: Negative.   HENT: Negative.    Eyes: Negative.  Negative for blurred vision.  Respiratory: Negative.    Cardiovascular: Negative.   Gastrointestinal: Negative.   Endocrine: Negative for polydipsia, polyphagia and polyuria.  Musculoskeletal: Negative.   Skin: Negative.   Psychiatric/Behavioral: Negative.       Today's Vitals   10/13/22 0821  BP: 120/84  Pulse: 86  Temp: 97.7 F (36.5 C)  Weight: (!) 319 lb 3.2 oz (144.8 kg)  Height: 6' (1.829 m)  PainSc: 5   PainLoc: Shoulder   Body mass index is 43.29 kg/m.   Wt Readings from Last 3 Encounters:  10/13/22 (!) 319 lb 3.2 oz (144.8 kg)  08/14/22 (!) 312 lb (141.5 kg)  07/07/22 (!) 311 lb (141.1 kg)     Objective:  Physical Exam Vitals and nursing note reviewed.  Constitutional:      Appearance: Normal appearance.  HENT:     Head: Normocephalic and atraumatic.     Nose:     Comments: Masked     Mouth/Throat:     Comments: Masked  Eyes:     Extraocular Movements: Extraocular movements intact.  Cardiovascular:     Rate and Rhythm: Normal rate and regular rhythm.     Heart sounds: Normal heart sounds.  Pulmonary:     Effort: Pulmonary effort is normal.     Breath sounds: Normal breath sounds.  Musculoskeletal:     Cervical back: Normal range of motion.  Skin:    General: Skin is warm.  Neurological:     General: No focal deficit  present.     Mental Status: He is alert.  Psychiatric:        Mood and Affect: Mood normal.       Assessment And Plan:     1. Dyslipidemia due to type 2 diabetes mellitus ([place]) Comments: Chronic, I will check labs as below We discussed use of SGLT2 inh for renal/cardiac protection. Plan to switch to [facility] when he runs out of metformin.  He is currently on statin therapy.  - Hemoglobin A1c - CMP14+EGFR - Microalbumin / Creatinine Urine Ratio  2. Essential hypertension Comments: Chronic, well controlled with lisinopril.  3. Vitamin D deficiency disease Comments: I will check vitamin D level and supplement as needed. - Vitamin D (25 hydroxy)  4. Class 3 severe obesity due to excess calories with serious comorbidity and body mass index (BMI) of 40.0 to 44.9 in adult [facility]) Comments: BMI 43. She is encouraged to aim for at least 150 minutes of exercise per week, while  initially striving for BMi<35 to decrease cardiac risk.   Patient was given opportunity to ask questions. Patient verbalized understanding of the plan and was able to repeat key elements of the plan. All questions were answered to their satisfaction.   I, [name], MD, have reviewed all documentation for this visit. The documentation on 10/13/22 for the exam, diagnosis, procedures, and orders are all accurate and complete.   IF YOU HAVE BEEN REFERRED TO A SPECIALIST, IT MAY TAKE 1-2 WEEKS TO SCHEDULE/PROCESS THE REFERRAL. IF YOU HAVE NOT HEARD FROM US/SPECIALIST IN TWO WEEKS, PLEASE GIVE [place] A CALL AT [phone] X [phone].   THE PATIENT IS ENCOURAGED TO PRACTICE SOCIAL DISTANCING DUE TO THE COVID-19 PANDEMIC.

## 2022-10-14 LAB — MICROALBUMIN / CREATININE URINE RATIO
Creatinine, Urine: 92.8 mg/dL
Microalb/Creat Ratio: <3 mg/g creat (ref 0–29)
Microalbumin, Urine: <3 ug/mL

## 2022-10-14 LAB — CMP14+EGFR
ALT: 21 IU/L (ref 0–44)
AST: 17 IU/L (ref 0–40)
Albumin/Globulin Ratio: 1.5 (ref 1.2–2.2)
Albumin: 4.3 g/dL (ref 4.1–5.1)
Alkaline Phosphatase: 43 IU/L — ABNORMAL LOW (ref 44–121)
BUN/Creatinine Ratio: 13 (ref 9–20)
BUN: 14 mg/dL (ref 6–24)
Bilirubin Total: 0.8 mg/dL (ref 0.0–1.2)
CO2: 23 mmol/L (ref 20–29)
Calcium: 9.2 mg/dL (ref 8.7–10.2)
Chloride: 100 mmol/L (ref 96–106)
Creatinine, Ser: 1.11 mg/dL (ref 0.76–1.27)
Globulin, Total: 2.8 g/dL (ref 1.5–4.5)
Glucose: 136 mg/dL — ABNORMAL HIGH (ref 70–99)
Potassium: 4.3 mmol/L (ref 3.5–5.2)
Sodium: 140 mmol/L (ref 134–144)
Total Protein: 7.1 g/dL (ref 6.0–8.5)
eGFR: 82 mL/min/{1.73_m2} (ref >59)

## 2022-10-14 LAB — HEMOGLOBIN A1C
Est. average glucose Bld gHb Est-mCnc: 160 mg/dL
Hgb A1c MFr Bld: 7.2 % — ABNORMAL HIGH (ref 4.8–5.6)

## 2022-10-14 LAB — VITAMIN D 25 HYDROXY (VIT D DEFICIENCY, FRACTURES): Vit D, 25-Hydroxy: 24.2 ng/mL — ABNORMAL LOW (ref 30.0–100.0)

## 2022-10-18 ENCOUNTER — Other Ambulatory Visit: Payer: Self-pay | Admitting: Internal Medicine

## 2022-10-18 MED ORDER — VITAMIN D (ERGOCALCIFEROL) 1.25 MG (50000 UNIT) PO CAPS: 50000.0000 [IU] | ORAL_CAPSULE | ORAL | 0 refills | Status: DC

## 2022-10-31 ENCOUNTER — Other Ambulatory Visit: Payer: Self-pay | Admitting: Internal Medicine

## 2023-01-05 ENCOUNTER — Encounter: Payer: BC Managed Care – PPO | Admitting: Internal Medicine

## 2023-01-15 ENCOUNTER — Other Ambulatory Visit: Payer: Self-pay | Admitting: Internal Medicine

## 2023-01-20 ENCOUNTER — Encounter: Payer: Self-pay | Admitting: Internal Medicine

## 2023-01-20 ENCOUNTER — Ambulatory Visit (INDEPENDENT_AMBULATORY_CARE_PROVIDER_SITE_OTHER): Payer: BC Managed Care – PPO | Admitting: Internal Medicine

## 2023-01-20 VITALS — BP 122/78 | HR 100 | Temp 97.7°F | Ht 72.0 in | Wt 321.4 lb

## 2023-01-20 DIAGNOSIS — Z6841 Body Mass Index (BMI) 40.0 and over, adult: Secondary | ICD-10-CM

## 2023-01-20 DIAGNOSIS — I10 Essential (primary) hypertension: Secondary | ICD-10-CM

## 2023-01-20 DIAGNOSIS — Z0000 Encounter for general adult medical examination without abnormal findings: Secondary | ICD-10-CM | POA: Diagnosis not present

## 2023-01-20 DIAGNOSIS — E1169 Type 2 diabetes mellitus with other specified complication: Secondary | ICD-10-CM | POA: Diagnosis not present

## 2023-01-20 DIAGNOSIS — E785 Hyperlipidemia, unspecified: Secondary | ICD-10-CM

## 2023-01-20 LAB — POCT URINALYSIS DIPSTICK
Bilirubin, UA: NEGATIVE
Blood, UA: NEGATIVE
Glucose, UA: NEGATIVE
Ketones, UA: NEGATIVE
Leukocytes, UA: NEGATIVE
Nitrite, UA: NEGATIVE
Protein, UA: NEGATIVE
Spec Grav, UA: >=1.03 — AB (ref 1.010–1.025)
Urobilinogen, UA: 0.2 E.U./dL
pH, UA: 6 (ref 5.0–8.0)

## 2023-01-20 LAB — POC HEMOCCULT BLD/STL (OFFICE/1-CARD/DIAGNOSTIC): Fecal Occult Blood, POC: NEGATIVE

## 2023-01-20 MED ORDER — MELOXICAM 15 MG PO TABS
ORAL_TABLET | ORAL | 0 refills | Status: DC
Start: 2023-01-20 — End: 2024-05-09

## 2023-01-20 MED ORDER — OZEMPIC (2 MG/DOSE) 8 MG/3ML ~~LOC~~ SOPN
2.0000 mg | PEN_INJECTOR | SUBCUTANEOUS | 2 refills | Status: DC
Start: 2023-01-20 — End: 2023-11-24

## 2023-01-20 NOTE — Progress Notes (Signed)
I,[name],acting as a scribe for [name], MD.,have documented all relevant documentation on the behalf of [name], MD,as directed by  [name], MD while in the presence of [name], MD.   Subjective:     Patient ID: Joel Wiley , male    DOB: October 27, 1976 , 47 y.o.   MRN: [id number]   Chief Complaint  Patient presents with   Annual Exam   Diabetes   Hypertension    HPI  Patient here for physical exam. He denies having any specific concerns at this time. He reports compliance with meds. He states his sugars have been elevated recently, usually in the 160s. Admits he hasn't been eating right. Also states he has been unable to get Ozempic due to national backorder. He denies headaches, chest pain and shortness of breath.   Diabetes He presents for his follow-up diabetic visit. He has type 2 diabetes mellitus. His disease course has been improving. Pertinent negatives for diabetes include no blurred vision. There are no hypoglycemic complications. Risk factors for coronary artery disease include diabetes mellitus, dyslipidemia, hypertension, obesity and male sex. He participates in exercise intermittently. His breakfast blood glucose is taken between 8-9 am. His breakfast blood glucose range is generally 110-130 mg/dl. Eye exam is not current.  Hypertension This is a chronic problem. The current episode started more than 1 year ago. The problem has been gradually improving since onset. The problem is controlled. Pertinent negatives include no blurred vision. The current treatment provides moderate improvement.     Past Medical History:  Diagnosis Date   Diabetes ([place])    High cholesterol    Hypertension    Morbid obesity with BMI of 40.0-44.9, adult ([place])      Family History  Problem Relation Age of Onset   Hyperlipidemia Mother    Hypertension Mother    Diabetes Mother    Stroke Mother    Heart disease Father        heavy smoker    Hypertension Father    Hyperlipidemia Father      Current Outpatient Medications:    aspirin EC 81 MG tablet, Take 81 mg by mouth daily., Disp: , Rfl:    atorvastatin (LIPITOR) 40 MG tablet, TAKE 1 TABLET BY MOUTH EVERY DAY, Disp: 90 tablet, Rfl: 3   lisinopril (ZESTRIL) 5 MG tablet, TAKE 1 TABLET (5 MG TOTAL) BY MOUTH DAILY., Disp: 90 tablet, Rfl: 2   metFORMIN (GLUCOPHAGE) 500 MG tablet, TAKE 1 TABLET BY MOUTH 2 TIMES DAILY WITH A MEAL., Disp: 180 tablet, Rfl: 2   Vitamin D, Ergocalciferol, (DRISDOL) 1.25 MG (50000 UNIT) CAPS capsule, Take 1 capsule (50,000 Units total) by mouth every 7 (seven) days., Disp: 12 capsule, Rfl: 0   meloxicam (MOBIC) 15 MG tablet, One tab po qd prn, Disp: 30 tablet, Rfl: 0   Semaglutide, 2 MG/DOSE, (OZEMPIC, 2 MG/DOSE,) 8 MG/3ML SOPN, Inject 2 mg into the skin once a week., Disp: 9 mL, Rfl: 2   No Known Allergies   Men's preventive visit. Patient Health Questionnaire (PHQ-2) is  [place] [facility] Visit from 01/20/2023 in [facility]  PHQ-2 Total Score 0     . Patient is on a diabetic diet. Marital status: Married. Relevant history for alcohol use is:  Social History   Substance and Sexual Activity  Alcohol Use Yes   Comment: occasionally   . Relevant history for tobacco use is:  Social History  Tobacco Use  Smoking Status Never  Smokeless Tobacco Never  .   Review of Systems  Constitutional: Negative.   HENT: Negative.    Eyes: Negative.  Negative for blurred vision.  Respiratory: Negative.    Cardiovascular: Negative.   Endocrine: Negative.   Genitourinary: Negative.   Musculoskeletal: Negative.   Skin: Negative.   Allergic/Immunologic: Negative.   Neurological: Negative.   Hematological: Negative.      Today's Vitals   01/20/23 0918  BP: 122/78  Pulse: 100  Temp: 97.7 F (36.5 C)  SpO2: 98%  Weight: (!) 321 lb 6.4 oz (145.8 kg)  Height: 6' (1.829 m)   Body mass index is 43.59 kg/m.  Wt  Readings from Last 3 Encounters:  01/20/23 (!) 321 lb 6.4 oz (145.8 kg)  10/13/22 (!) 319 lb 3.2 oz (144.8 kg)  08/14/22 (!) 312 lb (141.5 kg)    Objective:  Physical Exam Vitals and nursing note reviewed.  Constitutional:      Appearance: Normal appearance.  HENT:     Head: Normocephalic and atraumatic.     Right Ear: Tympanic membrane, ear canal and external ear normal.     Left Ear: Tympanic membrane, ear canal and external ear normal.     Nose:     Comments: Masked     Mouth/Throat:     Comments: Masked  Eyes:     Extraocular Movements: Extraocular movements intact.     Conjunctiva/sclera: Conjunctivae normal.     Pupils: Pupils are equal, round, and reactive to light.  Cardiovascular:     Rate and Rhythm: Normal rate and regular rhythm.     Pulses: Normal pulses.          Dorsalis pedis pulses are 2+ on the right side and 2+ on the left side.     Heart sounds: Normal heart sounds.  Pulmonary:     Effort: Pulmonary effort is normal.     Breath sounds: Normal breath sounds.  Chest:  Breasts:    Right: Normal. No swelling, bleeding, inverted nipple, mass or nipple discharge.     Left: Normal. No swelling, bleeding, inverted nipple, mass or nipple discharge.  Abdominal:     General: Abdomen is flat. Bowel sounds are normal.     Palpations: Abdomen is soft.  Genitourinary:    Prostate: Normal.     Rectum: Normal. Guaiac result negative.  Musculoskeletal:        General: Normal range of motion.     Cervical back: Normal range of motion and neck supple.  Feet:     Right foot:     Protective Sensation: 5 sites tested.  5 sites sensed.     Skin integrity: Dry skin present.     Toenail Condition: Right toenails are long.     Left foot:     Protective Sensation: 5 sites tested.  5 sites sensed.     Skin integrity: Dry skin present.     Toenail Condition: Left toenails are long.  Skin:    General: Skin is warm.  Neurological:     General: No focal deficit present.      Mental Status: He is alert.  Psychiatric:        Mood and Affect: Mood normal.        Behavior: Behavior normal.      Assessment And Plan:    1. Encounter for general adult medical examination w/o abnormal findings Comments: A full exam was performed, DRE performed. Stool is heme  negative.  PATIENT IS ADVISED TO GET 30-45 MINUTES REGULAR EXERCISE NO LESS THAN FOUR TO FIVE DAYS PER WEEK - BOTH WEIGHTBEARING EXERCISES AND AEROBIC ARE RECOMMENDED.  PATIENT IS ADVISED TO FOLLOW A HEALTHY DIET WITH AT LEAST SIX FRUITS/VEGGIES PER DAY, DECREASE INTAKE OF RED MEAT, AND TO INCREASE FISH INTAKE TO TWO DAYS PER WEEK.  MEATS/FISH SHOULD NOT BE FRIED, BAKED OR BROILED IS PREFERABLE.  IT IS ALSO IMPORTANT TO CUT BACK ON YOUR SUGAR INTAKE. PLEASE AVOID ANYTHING WITH ADDED SUGAR, CORN SYRUP OR OTHER SWEETENERS. IF YOU MUST USE A SWEETENER, YOU CAN TRY STEVIA. IT IS ALSO IMPORTANT TO AVOID ARTIFICIALLY SWEETENERS AND DIET BEVERAGES. LASTLY, I SUGGEST WEARING SPF 50 SUNSCREEN ON EXPOSED PARTS AND ESPECIALLY WHEN IN THE DIRECT SUNLIGHT FOR AN EXTENDED PERIOD OF TIME.  PLEASE AVOID FAST FOOD RESTAURANTS AND INCREASE YOUR WATER INTAKE. - POC Hemoccult Bld/Stl (1-Cd Office Dx)  2. Dyslipidemia due to type 2 diabetes mellitus ([place]) Comments: Chronic, diabetic foot exam was performed.  He will rto in 3-4 months for re-evaluation.  I reviewed patient's medical record, external notes, lab results, and imaging reports. As I have discussed with the patient in detail, compliance with diabetes medications, meal planning, exercise, and self-monitoring regimens are essential to improving glucose control. Target HgbA1C is 7.0%. We discussed blood sugar goals including decreasing HgbA1C to target, fasting & premeal blood sugars between 80-130, and 2 hr post meal blood sugars <160. Emphasized that for every 1% that the HgbA1C is above this target range, risk of microvascular complications, including retinopathy, nephropathy, and  neuropathy increase by 30%, and risk of macrovascular disease, including heart attack, peripheral vascular disease, and stroke increased by 15%. We discussed carbohydrates and their influence on blood sugars and cited examples of common higher carb foods to include junk food, bread, rice, pasta, corn, potatoes, fruit, and fruit juices to name a few. We discussed estimating carbohydrate portion sizes, the importance of meal planning and balancing carbohydrate intake. The positive impact of an active lifestyle on diabetes and health explained, and goals set to incorporate more activity and exercise into weekly schedule. Hypoglycemia recognition, prevention, and treatment discussed with patient. If patient's BG is low, discussed eating 15g of carbs and waiting 15 minutes and rechecking BG, until BG is above 70. If BG doesn't improve to normal, patient is to call office or doctor on call if after hours. - POCT Urinalysis Dipstick (81002) - Microalbumin / creatinine urine ratio - EKG 12-Lead - CMP14+EGFR - Lipid panel - CBC - Hemoglobin A1c - PSA  3. Essential hypertension Comments: Chronic. EKG performed, NSR w/o acute changes. - POCT Urinalysis Dipstick (81002) - Microalbumin / creatinine urine ratio - EKG 12-Lead - CMP14+EGFR - Lipid panel - CBC - Hemoglobin A1c - PSA  4. Class 3 severe obesity due to excess calories with serious comorbidity and body mass index (BMI) of 40.0 to 44.9 in adult ([facility]) BMI 43. He is encouraged to strive for BMI less than 30 to decrease cardiac risk. Advised to aim for at least 150 minutes of exercise per week.   Patient was given opportunity to ask questions. Patient verbalized understanding of the plan and was able to repeat key elements of the plan. All questions were answered to their satisfaction.   I, [name], MD, have reviewed all documentation for this visit. The documentation on 01/20/23 for the exam, diagnosis, procedures, and orders are all  accurate and complete.   THE PATIENT IS ENCOURAGED TO PRACTICE SOCIAL  DISTANCING DUE TO THE COVID-19 PANDEMIC.

## 2023-01-20 NOTE — Patient Instructions (Signed)
Health Maintenance, Male Adopting a healthy lifestyle and getting preventive care are important in promoting health and wellness. Ask your health care provider about: The right schedule for you to have regular tests and exams. Things you can do on your own to prevent diseases and keep yourself healthy. What should I know about diet, weight, and exercise? Eat a healthy diet  Eat a diet that includes plenty of vegetables, fruits, low-fat dairy products, and lean protein. Do not eat a lot of foods that are high in solid fats, added sugars, or sodium. Maintain a healthy weight Body mass index (BMI) is a measurement that can be used to identify possible weight problems. It estimates body fat based on height and weight. Your health care provider can help determine your BMI and help you achieve or maintain a healthy weight. Get regular exercise Get regular exercise. This is one of the most important things you can do for your health. Most adults should: Exercise for at least 150 minutes each week. The exercise should increase your heart rate and make you sweat (moderate-intensity exercise). Do strengthening exercises at least twice a week. This is in addition to the moderate-intensity exercise. Spend less time sitting. Even light physical activity can be beneficial. Watch cholesterol and blood lipids Have your blood tested for lipids and cholesterol at 49 years of age, then have this test every 5 years. You may need to have your cholesterol levels checked more often if: Your lipid or cholesterol levels are high. You are older than 49 years of age. You are at high risk for heart disease. What should I know about cancer screening? Many types of cancers can be detected early and may often be prevented. Depending on your health history and family history, you may need to have cancer screening at various ages. This may include screening for: Colorectal cancer. Prostate cancer. Skin cancer. Lung  cancer. What should I know about heart disease, diabetes, and high blood pressure? Blood pressure and heart disease High blood pressure causes heart disease and increases the risk of stroke. This is more likely to develop in people who have high blood pressure readings or are overweight. Talk with your health care provider about your target blood pressure readings. Have your blood pressure checked: Every 3-5 years if you are 18-39 years of age. Every year if you are 40 years old or older. If you are between the ages of 65 and 75 and are a current or former smoker, ask your health care provider if you should have a one-time screening for abdominal aortic aneurysm (AAA). Diabetes Have regular diabetes screenings. This checks your fasting blood sugar level. Have the screening done: Once every three years after age 45 if you are at a normal weight and have a low risk for diabetes. More often and at a younger age if you are overweight or have a high risk for diabetes. What should I know about preventing infection? Hepatitis B If you have a higher risk for hepatitis B, you should be screened for this virus. Talk with your health care provider to find out if you are at risk for hepatitis B infection. Hepatitis C Blood testing is recommended for: Everyone born from 1945 through 1965. Anyone with known risk factors for hepatitis C. Sexually transmitted infections (STIs) You should be screened each year for STIs, including gonorrhea and chlamydia, if: You are sexually active and are younger than 49 years of age. You are older than 49 years of age and your   health care provider tells you that you are at risk for this type of infection. Your sexual activity has changed since you were last screened, and you are at increased risk for chlamydia or gonorrhea. Ask your health care provider if you are at risk. Ask your health care provider about whether you are at high risk for HIV. Your health care provider  may recommend a prescription medicine to help prevent HIV infection. If you choose to take medicine to prevent HIV, you should first get tested for HIV. You should then be tested every 3 months for as long as you are taking the medicine. Follow these instructions at home: Alcohol use Do not drink alcohol if your health care provider tells you not to drink. If you drink alcohol: Limit how much you have to 0-2 drinks a day. Know how much alcohol is in your drink. In the [place] one drink equals one 12 oz bottle of beer (355 mL), one 5 oz glass of wine (148 mL), or one 1 oz glass of hard liquor (44 mL). Lifestyle Do not use any products that contain nicotine or tobacco. These products include cigarettes, chewing tobacco, and vaping devices, such as e-cigarettes. If you need help quitting, ask your health care provider. Do not use street drugs. Do not share needles. Ask your health care provider for help if you need support or information about quitting drugs. General instructions Schedule regular health, dental, and eye exams. Stay current with your vaccines. Tell your health care provider if: You often feel depressed. You have ever been abused or do not feel safe at home. Summary Adopting a healthy lifestyle and getting preventive care are important in promoting health and wellness. Follow your health care provider's instructions about healthy diet, exercising, and getting tested or screened for diseases. Follow your health care provider's instructions on monitoring your cholesterol and blood pressure. This information is not intended to replace advice given to you by your health care provider. Make sure you discuss any questions you have with your health care provider. Document Revised: 04/14/2021 Document Reviewed: 04/14/2021 Elsevier Patient Education  2023 [employer] Inc.  

## 2023-01-21 LAB — CBC
Hematocrit: 48 % (ref 37.5–51.0)
Hemoglobin: 15.6 g/dL (ref 13.0–17.7)
MCH: 29.9 pg (ref 26.6–33.0)
MCHC: 32.5 g/dL (ref 31.5–35.7)
MCV: 92 fL (ref 79–97)
Platelets: 160 10*3/uL (ref 150–450)
RBC: 5.22 x10E6/uL (ref 4.14–5.80)
RDW: 11.9 % (ref 11.6–15.4)
WBC: 6.6 10*3/uL (ref 3.4–10.8)

## 2023-01-21 LAB — CMP14+EGFR
ALT: 24 IU/L (ref 0–44)
AST: 20 IU/L (ref 0–40)
Albumin/Globulin Ratio: 1.6 (ref 1.2–2.2)
Albumin: 4.5 g/dL (ref 4.1–5.1)
Alkaline Phosphatase: 41 IU/L — ABNORMAL LOW (ref 44–121)
BUN/Creatinine Ratio: 12 (ref 9–20)
BUN: 14 mg/dL (ref 6–24)
Bilirubin Total: 0.8 mg/dL (ref 0.0–1.2)
CO2: 22 mmol/L (ref 20–29)
Calcium: 9.8 mg/dL (ref 8.7–10.2)
Chloride: 102 mmol/L (ref 96–106)
Creatinine, Ser: 1.13 mg/dL (ref 0.76–1.27)
Globulin, Total: 2.9 g/dL (ref 1.5–4.5)
Glucose: 138 mg/dL — ABNORMAL HIGH (ref 70–99)
Potassium: 4.1 mmol/L (ref 3.5–5.2)
Sodium: 139 mmol/L (ref 134–144)
Total Protein: 7.4 g/dL (ref 6.0–8.5)
eGFR: 80 mL/min/{1.73_m2} (ref >59)

## 2023-01-21 LAB — LIPID PANEL
Chol/HDL Ratio: 3.2 ratio (ref 0.0–5.0)
Cholesterol, Total: 146 mg/dL (ref 100–199)
HDL: 45 mg/dL (ref >39)
LDL Chol Calc (NIH): 87 mg/dL (ref 0–99)
Triglycerides: 72 mg/dL (ref 0–149)
VLDL Cholesterol Cal: 14 mg/dL (ref 5–40)

## 2023-01-21 LAB — MICROALBUMIN / CREATININE URINE RATIO
Creatinine, Urine: 169.8 mg/dL
Microalb/Creat Ratio: 4 mg/g creat (ref 0–29)
Microalbumin, Urine: 7.3 ug/mL

## 2023-01-21 LAB — HEMOGLOBIN A1C
Est. average glucose Bld gHb Est-mCnc: 177 mg/dL
Hgb A1c MFr Bld: 7.8 % — ABNORMAL HIGH (ref 4.8–5.6)

## 2023-01-21 LAB — PSA: Prostate Specific Ag, Serum: 0.9 ng/mL (ref 0.0–4.0)

## 2023-02-21 ENCOUNTER — Encounter: Payer: Self-pay | Admitting: Internal Medicine

## 2023-03-09 ENCOUNTER — Encounter: Payer: Self-pay | Admitting: Internal Medicine

## 2023-03-11 ENCOUNTER — Encounter: Payer: Self-pay | Admitting: Internal Medicine

## 2023-03-11 ENCOUNTER — Ambulatory Visit: Payer: BC Managed Care – PPO | Admitting: Internal Medicine

## 2023-03-11 VITALS — BP 120/84 | HR 84 | Temp 98.1°F | Ht 72.0 in | Wt 318.2 lb

## 2023-03-11 DIAGNOSIS — L02429 Furuncle of limb, unspecified: Secondary | ICD-10-CM | POA: Diagnosis not present

## 2023-03-11 DIAGNOSIS — Z6841 Body Mass Index (BMI) 40.0 and over, adult: Secondary | ICD-10-CM | POA: Diagnosis not present

## 2023-03-11 NOTE — Progress Notes (Signed)
I,Joel Wiley,acting as a scribe for Joel Aliment, MD.,have documented all relevant documentation on the behalf of Joel Aliment, MD,as directed by  Joel Aliment, MD while in the presence of Joel Aliment, MD.    Subjective:     Patient ID: Joel Wiley , male    DOB: 1976-06-28 , 47 y.o.   MRN: [id number]   Chief Complaint  Patient presents with   Cyst    HPI  Pt presents today for evaluation of a knot under right arm. He admits this is the first time he has experienced this. He first saw this on Tuesday. There has not been any drainage.  Marland Kitchen  He reports having DM eye exam scheduled for April 15th.         Past Medical History:  Diagnosis Date   Diabetes    High cholesterol    Hypertension    Morbid obesity with BMI of 40.0-44.9, adult      Family History  Problem Relation Age of Onset   Hyperlipidemia Mother    Hypertension Mother    Diabetes Mother    Stroke Mother    Heart disease Father        heavy smoker   Hypertension Father    Hyperlipidemia Father      Current Outpatient Medications:    aspirin EC 81 MG tablet, Take 81 mg by mouth daily., Disp: , Rfl:    atorvastatin (LIPITOR) 40 MG tablet, TAKE 1 TABLET BY MOUTH EVERY DAY, Disp: 90 tablet, Rfl: 3   lisinopril (ZESTRIL) 5 MG tablet, TAKE 1 TABLET (5 MG TOTAL) BY MOUTH DAILY., Disp: 90 tablet, Rfl: 2   meloxicam (MOBIC) 15 MG tablet, One tab po qd prn, Disp: 30 tablet, Rfl: 0   metFORMIN (GLUCOPHAGE) 500 MG tablet, TAKE 1 TABLET BY MOUTH 2 TIMES DAILY WITH A MEAL., Disp: 180 tablet, Rfl: 2   Semaglutide, 2 MG/DOSE, (OZEMPIC, 2 MG/DOSE,) 8 MG/3ML SOPN, Inject 2 mg into the skin once a week., Disp: 9 mL, Rfl: 2   Vitamin D, Ergocalciferol, (DRISDOL) 1.25 MG (50000 UNIT) CAPS capsule, Take 1 capsule (50,000 Units total) by mouth every 7 (seven) days., Disp: 12 capsule, Rfl: 0   No Known Allergies   Review of Systems  Constitutional: Negative.   Respiratory: Negative.     Cardiovascular: Negative.   Gastrointestinal: Negative.   Skin: Negative.   Allergic/Immunologic: Negative.   Hematological: Negative.   Psychiatric/Behavioral: Negative.       Today's Vitals   03/11/23 1458  BP: 120/84  Pulse: 84  Temp: 98.1 F (36.7 C)  SpO2: 98%  Weight: (!) 318 lb 3.2 oz (144.3 kg)  Height: 6' (1.829 m)   Body mass index is 43.16 kg/m.  Wt Readings from Last 3 Encounters:  03/11/23 (!) 318 lb 3.2 oz (144.3 kg)  01/20/23 (!) 321 lb 6.4 oz (145.8 kg)  10/13/22 (!) 319 lb 3.2 oz (144.8 kg)    Objective:  Physical Exam Vitals and nursing note reviewed.  Constitutional:      Appearance: Normal appearance.  HENT:     Nose:     Comments: Masked     Mouth/Throat:     Comments: Masked  Eyes:     Extraocular Movements: Extraocular movements intact.  Cardiovascular:     Rate and Rhythm: Normal rate and regular rhythm.     Heart sounds: Normal heart sounds.  Pulmonary:     Effort: Pulmonary effort is normal.  Breath sounds: Normal breath sounds.  Musculoskeletal:     Cervical back: Normal range of motion.  Skin:    General: Skin is warm.     Comments: Soft tissue nodule in r axilla. No drainage. No overlying erythema. No vesicular lesions noted.   Neurological:     General: No focal deficit present.     Mental Status: He is alert.  Psychiatric:        Mood and Affect: Mood normal.         Assessment And Plan:     1. Furuncle of axillary fold Comments: It is <29mm, I do not think oral abx are needed. He will apply warm compress twice daily and apply Bacitracin to area twice daily prn.  2. Class 3 severe obesity due to excess calories with serious comorbidity and body mass index (BMI) of 40.0 to 44.9 in adult He is encouraged to strive for BMI less than 30 to decrease cardiac risk. Advised to aim for at least 150 minutes of exercise per week.    Patient was given opportunity to ask questions. Patient verbalized understanding of the plan and  was able to repeat key elements of the plan. All questions were answered to their satisfaction.   I, Joel Aliment, MD, have reviewed all documentation for this visit. The documentation on 03/14/23 for the exam, diagnosis, procedures, and orders are all accurate and complete.   IF YOU HAVE BEEN REFERRED TO A SPECIALIST, IT MAY TAKE 1-2 WEEKS TO SCHEDULE/PROCESS THE REFERRAL. IF YOU HAVE NOT HEARD FROM US/SPECIALIST IN TWO WEEKS, PLEASE GIVE [place] A CALL AT [phone].   THE PATIENT IS ENCOURAGED TO PRACTICE SOCIAL DISTANCING DUE TO THE COVID-19 PANDEMIC.

## 2023-03-14 ENCOUNTER — Encounter: Payer: Self-pay | Admitting: Internal Medicine

## 2023-03-25 DIAGNOSIS — H2513 Age-related nuclear cataract, bilateral: Secondary | ICD-10-CM | POA: Diagnosis not present

## 2023-03-25 DIAGNOSIS — E119 Type 2 diabetes mellitus without complications: Secondary | ICD-10-CM | POA: Diagnosis not present

## 2023-03-25 DIAGNOSIS — H43813 Vitreous degeneration, bilateral: Secondary | ICD-10-CM | POA: Diagnosis not present

## 2023-03-25 LAB — HM DIABETES EYE EXAM

## 2023-04-18 ENCOUNTER — Encounter: Payer: Self-pay | Admitting: Internal Medicine

## 2023-04-20 ENCOUNTER — Ambulatory Visit: Payer: BC Managed Care – PPO | Admitting: Internal Medicine

## 2023-04-27 ENCOUNTER — Other Ambulatory Visit: Payer: Self-pay | Admitting: Internal Medicine

## 2023-05-05 ENCOUNTER — Ambulatory Visit: Payer: BC Managed Care – PPO | Admitting: Internal Medicine

## 2023-05-05 VITALS — BP 110/82 | HR 98 | Temp 97.8°F | Ht 72.0 in | Wt 312.0 lb

## 2023-05-05 DIAGNOSIS — I10 Essential (primary) hypertension: Secondary | ICD-10-CM | POA: Diagnosis not present

## 2023-05-05 DIAGNOSIS — E1169 Type 2 diabetes mellitus with other specified complication: Secondary | ICD-10-CM | POA: Diagnosis not present

## 2023-05-05 DIAGNOSIS — E785 Hyperlipidemia, unspecified: Secondary | ICD-10-CM

## 2023-05-05 DIAGNOSIS — Z6841 Body Mass Index (BMI) 40.0 and over, adult: Secondary | ICD-10-CM

## 2023-05-05 NOTE — Progress Notes (Signed)
I,[name],acting as a scribe for [name], MD.,have documented all relevant documentation on the behalf of [name], MD,as directed by  [name], MD while in the presence of [name], MD.    Subjective:     Patient ID: Joel Wiley , male    DOB: July 26, 1976 , 47 y.o.   MRN: [id number]   Chief Complaint  Patient presents with   Diabetes   Hypertension    HPI  Patient presents today for a DM and BP check, Patient reports complaince with medications and has no other concerns at this time. He states his sugars range from 150-170. Denies headache, chest pain, and SOB.   Diabetes He presents for his follow-up diabetic visit. He has type 2 diabetes mellitus. His disease course has been improving. Pertinent negatives for diabetes include no blurred vision, no polydipsia, no polyphagia and no polyuria. There are no hypoglycemic complications. Risk factors for coronary artery disease include diabetes mellitus, dyslipidemia, hypertension, obesity and male sex. He participates in exercise intermittently. His breakfast blood glucose is taken between 8-9 am. His breakfast blood glucose range is generally 110-130 mg/dl. Eye exam is not current.  Hypertension This is a chronic problem. The current episode started more than 1 year ago. The problem has been gradually improving since onset. The problem is controlled. Pertinent negatives include no blurred vision. The current treatment provides moderate improvement.     Past Medical History:  Diagnosis Date   Diabetes (HCC)    High cholesterol    Hypertension    Morbid obesity with BMI of 40.0-44.9, adult (HCC)      Family History  Problem Relation Age of Onset   Hyperlipidemia Mother    Hypertension Mother    Diabetes Mother    Stroke Mother    Heart disease Father        heavy smoker   Hypertension Father    Hyperlipidemia Father      Current Outpatient Medications:    aspirin EC 81 MG tablet,  Take 81 mg by mouth daily., Disp: , Rfl:    atorvastatin (LIPITOR) 40 MG tablet, TAKE 1 TABLET BY MOUTH EVERY DAY, Disp: 90 tablet, Rfl: 3   lisinopril (ZESTRIL) 5 MG tablet, TAKE 1 TABLET (5 MG TOTAL) BY MOUTH DAILY., Disp: 90 tablet, Rfl: 2   meloxicam (MOBIC) 15 MG tablet, One tab po qd prn, Disp: 30 tablet, Rfl: 0   metFORMIN (GLUCOPHAGE) 500 MG tablet, TAKE 1 TABLET BY MOUTH TWICE A DAY WITH FOOD, Disp: 180 tablet, Rfl: 2   Semaglutide, 2 MG/DOSE, (OZEMPIC, 2 MG/DOSE,) 8 MG/3ML SOPN, Inject 2 mg into the skin once a week., Disp: 9 mL, Rfl: 2   Vitamin D, Ergocalciferol, (DRISDOL) 1.25 MG (50000 UNIT) CAPS capsule, Take 1 capsule (50,000 Units total) by mouth every 7 (seven) days., Disp: 12 capsule, Rfl: 0   No Known Allergies   Review of Systems  Constitutional: Negative.   HENT: Negative.    Eyes:  Negative for blurred vision.  Respiratory: Negative.    Cardiovascular: Negative.   Gastrointestinal: Negative.   Endocrine: Negative for polydipsia, polyphagia and polyuria.  Musculoskeletal: Negative.   Skin: Negative.   Allergic/Immunologic: Negative.   Neurological: Negative.   Hematological: Negative.      Today's Vitals   05/05/23 1518  BP: 110/82  Pulse: 98  Temp: 97.8 F (36.6 C)  SpO2: 98%  Weight: (!) 312 lb (141.5 kg)  Height: 6' (1.829 m)  Wt Readings from Last 3 Encounters:  05/05/23 (!) 312 lb (141.5 kg)  03/11/23 (!) 318 lb 3.2 oz (144.3 kg)  01/20/23 (!) 321 lb 6.4 oz (145.8 kg)   BP Readings from Last 3 Encounters:  05/05/23 110/82  03/11/23 120/84  01/20/23 122/78     Body mass index is 42.31 kg/m.  The 10-year ASCVD risk score (Arnett DK, et al., 2019) is: 11%   Values used to calculate the score:     Age: 36 years     Sex: Male     Is Non-Hispanic African American: Yes     Diabetic: Yes     Tobacco smoker: No     Systolic Blood Pressure: 110 mmHg     Is BP treated: Yes     HDL Cholesterol: 45 mg/dL     Total Cholesterol: 146  mg/dL ++ Objective:  Physical Exam Vitals and nursing note reviewed.  Constitutional:      Appearance: Normal appearance. He is obese.  HENT:     Head: Normocephalic and atraumatic.  Eyes:     Extraocular Movements: Extraocular movements intact.  Cardiovascular:     Rate and Rhythm: Normal rate and regular rhythm.     Heart sounds: Normal heart sounds.  Pulmonary:     Effort: Pulmonary effort is normal.     Breath sounds: Normal breath sounds.  Musculoskeletal:     Cervical back: Normal range of motion.  Skin:    General: Skin is warm.  Neurological:     General: No focal deficit present.     Mental Status: He is alert.  Psychiatric:        Mood and Affect: Mood normal.         Assessment And Plan:     1. Dyslipidemia due to type 2 diabetes mellitus (HCC) Comments: Chronic, LDL goal < 70. He will c/w atorvastatin, Ozempic 2mg  weekly and metformin. Will need to add SGLT2-inhibitor in the near future. - Hemoglobin A1c - BMP8+EGFR  2. Essential hypertension Comments: Chronic, he will c/w lisinopril 5mg  daily. May need to consider increasing dose of lisinopril to 10mg  if diastolic continues to be elevated. OR change to ARB.  3. Class 3 severe obesity due to excess calories with serious comorbidity and body mass index (BMI) of 40.0 to 44.9 in adult (HCC) BMI 42. He was congratulated on 6lb weight loss in the past month. He is encouraged to initially strive for BMI less than 35 to decrease cardiac risk. Advised to aim for at least 150 minutes of exercise per week.    Return for 4 month dm & bpc.  Patient was given opportunity to ask questions. Patient verbalized understanding of the plan and was able to repeat key elements of the plan. All questions were answered to their satisfaction.   I, [name], MD, have reviewed all documentation for this visit. The documentation on 05/05/23 for the exam, diagnosis, procedures, and orders are all accurate and complete.   IF YOU  HAVE BEEN REFERRED TO A SPECIALIST, IT MAY TAKE 1-2 WEEKS TO SCHEDULE/PROCESS THE REFERRAL. IF YOU HAVE NOT HEARD FROM US/SPECIALIST IN TWO WEEKS, PLEASE GIVE [place] A CALL AT [phone].   THE PATIENT IS ENCOURAGED TO PRACTICE SOCIAL DISTANCING DUE TO THE COVID-19 PANDEMIC.

## 2023-05-05 NOTE — Patient Instructions (Signed)

## 2023-05-06 LAB — BMP8+EGFR
BUN/Creatinine Ratio: 12 (ref 9–20)
BUN: 15 mg/dL (ref 6–24)
CO2: 22 mmol/L (ref 20–29)
Calcium: 9.1 mg/dL (ref 8.7–10.2)
Chloride: 104 mmol/L (ref 96–106)
Creatinine, Ser: 1.23 mg/dL (ref 0.76–1.27)
Glucose: 116 mg/dL — ABNORMAL HIGH (ref 70–99)
Potassium: 4.2 mmol/L (ref 3.5–5.2)
Sodium: 140 mmol/L (ref 134–144)
eGFR: 72 mL/min/{1.73_m2} (ref >59)

## 2023-05-06 LAB — HEMOGLOBIN A1C
Est. average glucose Bld gHb Est-mCnc: 160 mg/dL
Hgb A1c MFr Bld: 7.2 % — ABNORMAL HIGH (ref 4.8–5.6)

## 2023-08-03 ENCOUNTER — Other Ambulatory Visit: Payer: Self-pay | Admitting: Internal Medicine

## 2023-09-02 ENCOUNTER — Encounter: Payer: Self-pay | Admitting: Internal Medicine

## 2023-09-02 ENCOUNTER — Ambulatory Visit: Payer: BC Managed Care – PPO | Admitting: Internal Medicine

## 2023-09-02 NOTE — Progress Notes (Deleted)
I,Sharlett Lienemann T Deloria Lair, CMA,acting as a Neurosurgeon for [name], MD.,have documented all relevant documentation on the behalf of [name], MD,as directed by  [name], MD while in the presence of [name], MD.  Subjective:  Patient ID: Joel Wiley , male    DOB: 22-Jun-1976 , 47 y.o.   MRN: [id number]  No chief complaint on file.   HPI  Patient presents today for a DM and BP check, Patient reports complaince with medications and has no other concerns at this time. Denies headache, chest pain & sob.  Diabetes He presents for his follow-up diabetic visit. He has type 2 diabetes mellitus. His disease course has been improving. Pertinent negatives for diabetes include no blurred vision, no polydipsia, no polyphagia and no polyuria. There are no hypoglycemic complications. Risk factors for coronary artery disease include diabetes mellitus, dyslipidemia, hypertension, obesity and male sex. He participates in exercise intermittently. His breakfast blood glucose is taken between 8-9 am. His breakfast blood glucose range is generally 110-130 mg/dl. Eye exam is not current.  Hypertension This is a chronic problem. The current episode started more than 1 year ago. The problem has been gradually improving since onset. The problem is controlled. Pertinent negatives include no blurred vision. The current treatment provides moderate improvement.    Past Medical History:  Diagnosis Date  . Diabetes (HCC)   . High cholesterol   . Hypertension   . Morbid obesity with BMI of 40.0-44.9, adult (HCC)      Family History  Problem Relation Age of Onset  . Hyperlipidemia Mother   . Hypertension Mother   . Diabetes Mother   . Stroke Mother   . Heart disease Father        heavy smoker  . Hypertension Father   . Hyperlipidemia Father      Current Outpatient Medications:  .  aspirin EC 81 MG tablet, Take 81 mg by mouth daily., Disp: , Rfl:  .  atorvastatin (LIPITOR) 40 MG tablet,  TAKE 1 TABLET BY MOUTH EVERY DAY, Disp: 90 tablet, Rfl: 3 .  lisinopril (ZESTRIL) 5 MG tablet, TAKE 1 TABLET (5 MG TOTAL) BY MOUTH DAILY., Disp: 90 tablet, Rfl: 2 .  meloxicam (MOBIC) 15 MG tablet, One tab po qd prn, Disp: 30 tablet, Rfl: 0 .  metFORMIN (GLUCOPHAGE) 500 MG tablet, TAKE 1 TABLET BY MOUTH TWICE A DAY WITH FOOD, Disp: 180 tablet, Rfl: 2 .  Semaglutide, 2 MG/DOSE, (OZEMPIC, 2 MG/DOSE,) 8 MG/3ML SOPN, Inject 2 mg into the skin once a week., Disp: 9 mL, Rfl: 2 .  Vitamin D, Ergocalciferol, (DRISDOL) 1.25 MG (50000 UNIT) CAPS capsule, Take 1 capsule (50,000 Units total) by mouth every 7 (seven) days., Disp: 12 capsule, Rfl: 0   No Known Allergies   Review of Systems  Constitutional: Negative.   HENT: Negative.    Eyes:  Negative for blurred vision.  Gastrointestinal: Negative.   Endocrine: Negative for polydipsia, polyphagia and polyuria.  Genitourinary: Negative.   Skin: Negative.   Allergic/Immunologic: Negative.   Neurological: Negative.   Hematological: Negative.     There were no vitals filed for this visit. There is no height or weight on file to calculate BMI.  Wt Readings from Last 3 Encounters:  05/05/23 (!) 312 lb (141.5 kg)  03/11/23 (!) 318 lb 3.2 oz (144.3 kg)  01/20/23 (!) 321 lb 6.4 oz (145.8 kg)     Objective:  Physical Exam      Assessment And Plan:  Dyslipidemia due to type 2 diabetes mellitus (HCC)  Essential hypertension  Vitamin D deficiency disease     No follow-ups on file.  Patient was given opportunity to ask questions. Patient verbalized understanding of the plan and was able to repeat key elements of the plan. All questions were answered to their satisfaction.  [name], MD  I, [name], MD, have reviewed all documentation for this visit. The documentation on 09/02/23 for the exam, diagnosis, procedures, and orders are all accurate and complete.   IF YOU HAVE BEEN REFERRED TO A SPECIALIST, IT MAY TAKE 1-2 WEEKS TO  SCHEDULE/PROCESS THE REFERRAL. IF YOU HAVE NOT HEARD FROM US/SPECIALIST IN TWO WEEKS, PLEASE GIVE [place] A CALL AT [phone].   THE PATIENT IS ENCOURAGED TO PRACTICE SOCIAL DISTANCING DUE TO THE COVID-19 PANDEMIC.

## 2023-09-15 ENCOUNTER — Encounter: Payer: Self-pay | Admitting: Internal Medicine

## 2023-09-15 ENCOUNTER — Ambulatory Visit: Payer: BC Managed Care – PPO | Admitting: Internal Medicine

## 2023-09-15 VITALS — BP 110/82 | HR 99 | Temp 98.5°F | Ht 72.0 in | Wt 311.8 lb

## 2023-09-15 DIAGNOSIS — E66813 Obesity, class 3: Secondary | ICD-10-CM

## 2023-09-15 DIAGNOSIS — I10 Essential (primary) hypertension: Secondary | ICD-10-CM | POA: Diagnosis not present

## 2023-09-15 DIAGNOSIS — E785 Hyperlipidemia, unspecified: Secondary | ICD-10-CM | POA: Diagnosis not present

## 2023-09-15 DIAGNOSIS — E1169 Type 2 diabetes mellitus with other specified complication: Secondary | ICD-10-CM

## 2023-09-15 DIAGNOSIS — Z6841 Body Mass Index (BMI) 40.0 and over, adult: Secondary | ICD-10-CM

## 2023-09-15 NOTE — Assessment & Plan Note (Signed)
Chronic, I will check labs as below. WE discussed possibly switching to [facility]. We also discussed increasing the dose of metformin and need for dietary changes. As of now, he will rto in Feb 2025 for his next physical exam. He will rto sooner if needed.

## 2023-09-15 NOTE — Progress Notes (Signed)
I,Victoria T Deloria Lair, CMA,acting as a Neurosurgeon for [name], MD.,have documented all relevant documentation on the behalf of [name], MD,as directed by  [name], MD while in the presence of [name], MD.  Subjective:  Patient ID: Joel Wiley , male    DOB: Oct 16, 1976 , 47 y.o.   MRN: [id number]  Chief Complaint  Patient presents with   Diabetes   Hypertension    HPI  Patient presents today for a DM and BP check, Patient reports complaince with medications and has no other concerns at this time. Denies headache, chest pain & sob.  He admits some dietary indiscretion. Highest sugar has been 180. Typical sugars in in the 120 range.   Diabetes He presents for his follow-up diabetic visit. He has type 2 diabetes mellitus. His disease course has been improving. Pertinent negatives for diabetes include no blurred vision, no polydipsia, no polyphagia and no polyuria. There are no hypoglycemic complications. Risk factors for coronary artery disease include diabetes mellitus, dyslipidemia, hypertension, obesity and male sex. He participates in exercise intermittently. His breakfast blood glucose is taken between 8-9 am. His breakfast blood glucose range is generally 110-130 mg/dl. Eye exam is not current.  Hypertension This is a chronic problem. The current episode started more than 1 year ago. The problem has been gradually improving since onset. The problem is controlled. Pertinent negatives include no blurred vision. The current treatment provides moderate improvement.     Past Medical History:  Diagnosis Date   Diabetes (HCC)    High cholesterol    Hypertension    Morbid obesity with BMI of 40.0-44.9, adult (HCC)      Family History  Problem Relation Age of Onset   Hyperlipidemia Mother    Hypertension Mother    Diabetes Mother    Stroke Mother    Heart disease Father        heavy smoker   Hypertension Father    Hyperlipidemia Father      Current  Outpatient Medications:    aspirin EC 81 MG tablet, Take 81 mg by mouth daily., Disp: , Rfl:    atorvastatin (LIPITOR) 40 MG tablet, TAKE 1 TABLET BY MOUTH EVERY DAY, Disp: 90 tablet, Rfl: 3   lisinopril (ZESTRIL) 5 MG tablet, TAKE 1 TABLET (5 MG TOTAL) BY MOUTH DAILY., Disp: 90 tablet, Rfl: 2   metFORMIN (GLUCOPHAGE) 500 MG tablet, TAKE 1 TABLET BY MOUTH TWICE A DAY WITH FOOD, Disp: 180 tablet, Rfl: 2   Semaglutide, 2 MG/DOSE, (OZEMPIC, 2 MG/DOSE,) 8 MG/3ML SOPN, Inject 2 mg into the skin once a week., Disp: 9 mL, Rfl: 2   Vitamin D, Ergocalciferol, (DRISDOL) 1.25 MG (50000 UNIT) CAPS capsule, Take 1 capsule (50,000 Units total) by mouth every 7 (seven) days., Disp: 12 capsule, Rfl: 0   meloxicam (MOBIC) 15 MG tablet, One tab po qd prn (Patient not taking: Reported on 09/15/2023), Disp: 30 tablet, Rfl: 0   No Known Allergies   Review of Systems  Constitutional: Negative.   HENT: Negative.    Eyes: Negative.  Negative for blurred vision.  Respiratory: Negative.    Cardiovascular: Negative.   Gastrointestinal: Negative.   Endocrine: Negative for polydipsia, polyphagia and polyuria.  Genitourinary: Negative.   Skin: Negative.   Allergic/Immunologic: Negative.   Neurological: Negative.   Hematological: Negative.      Today's Vitals   09/15/23 1428  BP: 110/82  Pulse: 99  Temp: 98.5 F (36.9 C)  SpO2: 98%  Weight: (!) 311 lb 12.8 oz (141.4 kg)  Height: 6' (1.829 m)   Body mass index is 42.29 kg/m.  Wt Readings from Last 3 Encounters:  09/15/23 (!) 311 lb 12.8 oz (141.4 kg)  05/05/23 (!) 312 lb (141.5 kg)  03/11/23 (!) 318 lb 3.2 oz (144.3 kg)     Objective:  Physical Exam Vitals and nursing note reviewed.  Constitutional:      Appearance: Normal appearance. He is obese.  HENT:     Head: Normocephalic and atraumatic.  Eyes:     Extraocular Movements: Extraocular movements intact.  Cardiovascular:     Rate and Rhythm: Normal rate and regular rhythm.     Heart sounds:  Normal heart sounds.  Pulmonary:     Effort: Pulmonary effort is normal.     Breath sounds: Normal breath sounds.  Musculoskeletal:     Cervical back: Normal range of motion.  Skin:    General: Skin is warm.  Neurological:     General: No focal deficit present.     Mental Status: He is alert.  Psychiatric:        Mood and Affect: Mood normal.         Assessment And Plan:  Dyslipidemia due to type 2 diabetes mellitus (HCC) Assessment & Plan: Chronic, I will check labs as below. WE discussed possibly switching to [facility]. We also discussed increasing the dose of metformin and need for dietary changes. As of now, he will rto in Feb 2025 for his next physical exam. He will rto sooner if needed.   Orders: -     CMP14+EGFR -     Hemoglobin A1c  Essential hypertension Assessment & Plan: Chronic, slight diastolic elevation noted. He is encouraged to aim for at least 150 minutes of exercise/week. He will continue with lisinopril 5mg  daily.    Class 3 severe obesity due to excess calories with serious comorbidity and body mass index (BMI) of 40.0 to 44.9 in adult [facility]) Assessment & Plan: BMI 42. He is encouraged to initially strive for BMI less than 35 to decrease cardiac risk. He is advised to exercise no less than 150 minutes per week.     He is encouraged to strive for BMI less than 30 to decrease cardiac risk. Advised to aim for at least 150 minutes of exercise per week.    Return if symptoms worsen or fail to improve.  Patient was given opportunity to ask questions. Patient verbalized understanding of the plan and was able to repeat key elements of the plan. All questions were answered to their satisfaction.    I, [name], MD, have reviewed all documentation for this visit. The documentation on 09/15/23 for the exam, diagnosis, procedures, and orders are all accurate and complete.   IF YOU HAVE BEEN REFERRED TO A SPECIALIST, IT MAY TAKE 1-2 WEEKS TO SCHEDULE/PROCESS  THE REFERRAL. IF YOU HAVE NOT HEARD FROM US/SPECIALIST IN TWO WEEKS, PLEASE GIVE [place] A CALL AT [phone].   THE PATIENT IS ENCOURAGED TO PRACTICE SOCIAL DISTANCING DUE TO THE COVID-19 PANDEMIC.

## 2023-09-15 NOTE — Assessment & Plan Note (Signed)
BMI 42. He is encouraged to initially strive for BMI less than 35 to decrease cardiac risk. He is advised to exercise no less than 150 minutes per week.

## 2023-09-15 NOTE — Patient Instructions (Signed)

## 2023-09-15 NOTE — Assessment & Plan Note (Signed)
Chronic, slight diastolic elevation noted. He is encouraged to aim for at least 150 minutes of exercise/week. He will continue with lisinopril 5mg  daily.

## 2023-09-16 LAB — CMP14+EGFR
ALT: 21 [IU]/L (ref 0–44)
AST: 18 [IU]/L (ref 0–40)
Albumin: 4.1 g/dL (ref 4.1–5.1)
Alkaline Phosphatase: 42 [IU]/L — ABNORMAL LOW (ref 44–121)
BUN/Creatinine Ratio: 11 (ref 9–20)
BUN: 12 mg/dL (ref 6–24)
Bilirubin Total: 0.8 mg/dL (ref 0.0–1.2)
CO2: 24 mmol/L (ref 20–29)
Calcium: 9.2 mg/dL (ref 8.7–10.2)
Chloride: 101 mmol/L (ref 96–106)
Creatinine, Ser: 1.05 mg/dL (ref 0.76–1.27)
Globulin, Total: 3 g/dL (ref 1.5–4.5)
Glucose: 109 mg/dL — ABNORMAL HIGH (ref 70–99)
Potassium: 4.2 mmol/L (ref 3.5–5.2)
Sodium: 138 mmol/L (ref 134–144)
Total Protein: 7.1 g/dL (ref 6.0–8.5)
eGFR: 87 mL/min/{1.73_m2} (ref >59)

## 2023-09-16 LAB — HEMOGLOBIN A1C
Est. average glucose Bld gHb Est-mCnc: 163 mg/dL
Hgb A1c MFr Bld: 7.3 % — ABNORMAL HIGH (ref 4.8–5.6)

## 2023-10-07 ENCOUNTER — Encounter: Payer: Self-pay | Admitting: Internal Medicine

## 2023-10-15 NOTE — Progress Notes (Signed)
Scanned document

## 2023-11-24 ENCOUNTER — Other Ambulatory Visit: Payer: Self-pay | Admitting: Internal Medicine

## 2024-01-30 ENCOUNTER — Other Ambulatory Visit: Payer: Self-pay | Admitting: Internal Medicine

## 2024-02-03 ENCOUNTER — Encounter: Payer: Self-pay | Admitting: Internal Medicine

## 2024-02-03 ENCOUNTER — Ambulatory Visit (INDEPENDENT_AMBULATORY_CARE_PROVIDER_SITE_OTHER): Payer: BC Managed Care – PPO | Admitting: Internal Medicine

## 2024-02-03 VITALS — BP 110/84 | HR 92 | Temp 98.1°F | Ht 72.0 in | Wt 317.2 lb

## 2024-02-03 DIAGNOSIS — E1169 Type 2 diabetes mellitus with other specified complication: Secondary | ICD-10-CM | POA: Diagnosis not present

## 2024-02-03 DIAGNOSIS — E66813 Obesity, class 3: Secondary | ICD-10-CM

## 2024-02-03 DIAGNOSIS — E785 Hyperlipidemia, unspecified: Secondary | ICD-10-CM

## 2024-02-03 DIAGNOSIS — Z6841 Body Mass Index (BMI) 40.0 and over, adult: Secondary | ICD-10-CM

## 2024-02-03 DIAGNOSIS — Z0000 Encounter for general adult medical examination without abnormal findings: Secondary | ICD-10-CM | POA: Insufficient documentation

## 2024-02-03 DIAGNOSIS — I10 Essential (primary) hypertension: Secondary | ICD-10-CM

## 2024-02-03 LAB — POCT URINALYSIS DIPSTICK
Bilirubin, UA: NEGATIVE
Blood, UA: NEGATIVE
Glucose, UA: NEGATIVE
Ketones, UA: NEGATIVE
Leukocytes, UA: NEGATIVE
Nitrite, UA: NEGATIVE
Protein, UA: NEGATIVE
Spec Grav, UA: 1.025 (ref 1.010–1.025)
Urobilinogen, UA: 0.2 U/dL
pH, UA: 5.5 (ref 5.0–8.0)

## 2024-02-03 MED ORDER — EMPAGLIFLOZIN 25 MG PO TABS: 25.0000 mg | ORAL_TABLET | Freq: Every day | ORAL | Status: DC

## 2024-02-03 MED ORDER — MOUNJARO 5 MG/0.5ML ~~LOC~~ SOAJ: 5.0000 mg | SUBCUTANEOUS | 0 refills | Status: DC

## 2024-02-03 NOTE — Progress Notes (Signed)
 I,Victoria T Deloria Lair, CMA,acting as a Neurosurgeon for [name], MD.,have documented all relevant documentation on the behalf of [name], MD,as directed by  [name], MD while in the presence of [name], MD.  Subjective:   Patient ID: Joel Wiley , male    DOB: 08-07-1976 , 48 y.o.   MRN: [id number]  Chief Complaint  Patient presents with   Annual Exam   Diabetes   Hypertension    HPI  Patient presents today for physical exam. He denies having any specific concerns at this time. He reports compliance with meds. He denies headaches, chest pain and shortness of breath.   He would like to discuss alternative for Ozempic. He states his insurance no longer covers this medication.  He states he recently tried to refill his meds, but was advised rx was over $500.      Diabetes He presents for his follow-up diabetic visit. He has type 2 diabetes mellitus. His disease course has been improving. Pertinent negatives for diabetes include no blurred vision. There are no hypoglycemic complications. Risk factors for coronary artery disease include diabetes mellitus, dyslipidemia, hypertension, obesity and male sex. He participates in exercise intermittently. His breakfast blood glucose is taken between 8-9 am. His breakfast blood glucose range is generally 110-130 mg/dl. Eye exam is not current.  Hypertension This is a chronic problem. The current episode started more than 1 year ago. The problem has been gradually improving since onset. The problem is controlled. Pertinent negatives include no blurred vision. The current treatment provides moderate improvement.     Past Medical History:  Diagnosis Date   Diabetes (HCC)    High cholesterol    Hypertension    Morbid obesity with BMI of 40.0-44.9, adult (HCC)      Family History  Problem Relation Age of Onset   Hyperlipidemia Mother    Hypertension Mother    Diabetes Mother    Stroke Mother    Heart disease Father         heavy smoker   Hypertension Father    Hyperlipidemia Father      Current Outpatient Medications:    aspirin EC 81 MG tablet, Take 81 mg by mouth daily., Disp: , Rfl:    atorvastatin (LIPITOR) 40 MG tablet, TAKE 1 TABLET BY MOUTH EVERY DAY, Disp: 90 tablet, Rfl: 3   empagliflozin (JARDIANCE) 25 MG TABS tablet, Take 1 tablet (25 mg total) by mouth daily before breakfast., Disp: , Rfl:    lisinopril (ZESTRIL) 5 MG tablet, TAKE 1 TABLET (5 MG TOTAL) BY MOUTH DAILY., Disp: 90 tablet, Rfl: 2   metFORMIN (GLUCOPHAGE) 500 MG tablet, TAKE 1 TABLET BY MOUTH TWICE A DAY WITH FOOD, Disp: 180 tablet, Rfl: 2   tirzepatide (MOUNJARO) 5 MG/0.5ML Pen, Inject 5 mg into the skin once a week., Disp: 2 mL, Rfl: 0   Vitamin D, Ergocalciferol, (DRISDOL) 1.25 MG (50000 UNIT) CAPS capsule, Take 1 capsule (50,000 Units total) by mouth every 7 (seven) days., Disp: 12 capsule, Rfl: 0   meloxicam (MOBIC) 15 MG tablet, One tab po qd prn (Patient not taking: Reported on 02/03/2024), Disp: 30 tablet, Rfl: 0   No Known Allergies   Men's preventive visit. Patient Health Questionnaire (PHQ-2) is  Flowsheet Row Office Visit from 02/03/2024 in [facility]  PHQ-2 Total Score 0     . Patient is on a diabetic diet. Marital status: Married. Relevant history for alcohol use is:  Social History   Substance and Sexual Activity  Alcohol Use Yes   Comment: occasionally   . Relevant history for tobacco use is:  Social History   Tobacco Use  Smoking Status Never  Smokeless Tobacco Never  .   Review of Systems  Constitutional: Negative.   HENT: Negative.    Eyes: Negative.  Negative for blurred vision.  Respiratory: Negative.    Cardiovascular: Negative.   Gastrointestinal: Negative.   Endocrine: Negative.   Genitourinary: Negative.   Musculoskeletal: Negative.   Skin: Negative.   Allergic/Immunologic: Negative.   Neurological: Negative.   Hematological: Negative.      Today's  Vitals   02/03/24 0855  BP: 110/84  Pulse: 92  Temp: 98.1 F (36.7 C)  SpO2: 98%  Weight: (!) 317 lb 3.2 oz (143.9 kg)  Height: 6' (1.829 m)   Body mass index is 43.02 kg/m.  Wt Readings from Last 3 Encounters:  02/03/24 (!) 317 lb 3.2 oz (143.9 kg)  09/15/23 (!) 311 lb 12.8 oz (141.4 kg)  05/05/23 (!) 312 lb (141.5 kg)    Objective:  Physical Exam Vitals and nursing note reviewed. Exam conducted with a chaperone present.  Constitutional:      Appearance: Normal appearance. He is obese.  HENT:     Head: Normocephalic and atraumatic.     Right Ear: Tympanic membrane, ear canal and external ear normal.     Left Ear: Tympanic membrane, ear canal and external ear normal.     Nose:     Comments: Masked     Mouth/Throat:     Comments: Masked  Eyes:     Extraocular Movements: Extraocular movements intact.     Conjunctiva/sclera: Conjunctivae normal.     Pupils: Pupils are equal, round, and reactive to light.  Cardiovascular:     Rate and Rhythm: Normal rate and regular rhythm.     Pulses: Normal pulses.          Dorsalis pedis pulses are 2+ on the right side and 2+ on the left side.     Heart sounds: Normal heart sounds.  Pulmonary:     Effort: Pulmonary effort is normal.     Breath sounds: Normal breath sounds.  Chest:  Breasts:    Right: Normal. No swelling, bleeding, inverted nipple, mass or nipple discharge.     Left: Normal. No swelling, bleeding, inverted nipple, mass or nipple discharge.  Abdominal:     General: Bowel sounds are normal.     Palpations: Abdomen is soft.     Comments: Rounded, soft.   Genitourinary:    Prostate: Normal.     Rectum: Normal. Guaiac result negative. No mass.  Musculoskeletal:        General: Normal range of motion.     Cervical back: Normal range of motion and neck supple.  Feet:     Right foot:     Protective Sensation: 5 sites tested.  5 sites sensed.     Skin integrity: Dry skin present.     Toenail Condition: Right toenails  are long.     Left foot:     Protective Sensation: 5 sites tested.  5 sites sensed.     Skin integrity: Dry skin present.     Toenail Condition: Left toenails are long.  Skin:    General: Skin is warm.  Neurological:     General: No focal deficit present.     Mental Status: He is alert.  Psychiatric:  Mood and Affect: Mood normal.        Behavior: Behavior normal.         Assessment And Plan:    Encounter for general adult medical examination w/o abnormal findings Assessment & Plan: A full exam was performed.  DRE performed, stool is heme negative.  He is advised to get 30-45 minutes of regular exercise, no less than four to five days per week. Both weight-bearing and aerobic exercises are recommended.  He is advised to follow a healthy diet with at least six fruits/veggies per day, decrease intake of red meat and other saturated fats and to increase fish intake to twice weekly.  Meats/fish should not be fried -- baked, boiled or broiled is preferable. It is also important to cut back on your sugar intake.  Be sure to read labels - try to avoid anything with added sugar, high fructose corn syrup or other sweeteners.  If you must use a sweetener, you can try stevia or monkfruit.  It is also important to avoid artificially sweetened foods/beverages and diet drinks. Lastly, wear SPF 50 sunscreen on exposed skin and when in direct sunlight for an extended period of time.  Be sure to avoid fast food restaurants and aim for at least 60 ounces of water daily.      Orders: -     CMP14+EGFR -     Lipid panel -     Hemoglobin A1c -     CBC -     PSA -     TSH -     POC Hemoccult Bld/Stl (1-Cd Office Dx)  Dyslipidemia due to type 2 diabetes mellitus (HCC) Assessment & Plan: Chronic, I will check labs as below. We discussed switching to [facility], he may have better weight loss benefit. I will send rx 5mg  weekly.  We also discussed use of SGLT2i therapy for decreased risk of cardiac/renal  complications. Possible side effects d/w patient. He was given samples of Jardiance 25mg  to take once daily.  Advised to take once daily and to stay well hydrated.  He will HOLD until I have reviewed his labs.  If we move forward with this, he agrees to rto in four weeks for re-evaluation. He will f/u in 3 months.   He will rto sooner if needed. I DISCUSSED WITH THE PATIENT AT LENGTH REGARDING THE GOALS OF GLYCEMIC CONTROL AND POSSIBLE LONG-TERM COMPLICATIONS.  I  ALSO STRESSED THE IMPORTANCE OF COMPLIANCE WITH HOME GLUCOSE MONITORING, DIETARY RESTRICTIONS INCLUDING AVOIDANCE OF SUGARY DRINKS/PROCESSED FOODS,  ALONG WITH REGULAR EXERCISE.  I  ALSO STRESSED THE IMPORTANCE OF ANNUAL EYE EXAMS, SELF FOOT CARE AND COMPLIANCE WITH OFFICE VISITS.   Orders: -     POCT urinalysis dipstick -     Microalbumin / creatinine urine ratio -     EKG 12-Lead  Essential hypertension Assessment & Plan: Chronic, controlled.  EKG performed, NSR w/o acute changes.  He will continue with lisinopril 5mg  daily.  He is encouraged to follow a low sodium diet.   Orders: -     POCT urinalysis dipstick -     Microalbumin / creatinine urine ratio -     EKG 12-Lead  Class 3 severe obesity due to excess calories with serious comorbidity and body mass index (BMI) of 40.0 to 44.9 in adult [facility]) Assessment & Plan: BMI 43.  He is encouraged to initially strive for BMI less than 35 to decrease cardiac risk. He is advised to exercise no less than 150  minutes per week.     Other orders -     Mounjaro; Inject 5 mg into the skin once a week.  Dispense: 2 mL; Refill: 0 -     Empagliflozin; Take 1 tablet (25 mg total) by mouth daily before breakfast.  He is encouraged to strive for BMI less than 30 to decrease cardiac risk. Advised to aim for at least 150 minutes of exercise per week.    Return for 1 year physical, 3 month dm check. Patient was given opportunity to ask questions. Patient verbalized understanding of the plan and  was able to repeat key elements of the plan. All questions were answered to their satisfaction.    I, [name], MD, have reviewed all documentation for this visit. The documentation on 02/03/24 for the exam, diagnosis, procedures, and orders are all accurate and complete.

## 2024-02-03 NOTE — Assessment & Plan Note (Signed)
 Chronic, controlled.  EKG performed, NSR w/o acute changes.  He will continue with lisinopril 5mg  daily.  He is encouraged to follow a low sodium diet.

## 2024-02-03 NOTE — Patient Instructions (Signed)
 Health Maintenance, Male  Adopting a healthy lifestyle and getting preventive care are important in promoting health and wellness. Ask your health care provider about:  The right schedule for you to have regular tests and exams.  Things you can do on your own to prevent diseases and keep yourself healthy.  What should I know about diet, weight, and exercise?  Eat a healthy diet    Eat a diet that includes plenty of vegetables, fruits, low-fat dairy products, and lean protein.  Do not eat a lot of foods that are high in solid fats, added sugars, or sodium.  Maintain a healthy weight  Body mass index (BMI) is a measurement that can be used to identify possible weight problems. It estimates body fat based on height and weight. Your health care provider can help determine your BMI and help you achieve or maintain a healthy weight.  Get regular exercise  Get regular exercise. This is one of the most important things you can do for your health. Most adults should:  Exercise for at least 150 minutes each week. The exercise should increase your heart rate and make you sweat (moderate-intensity exercise).  Do strengthening exercises at least twice a week. This is in addition to the moderate-intensity exercise.  Spend less time sitting. Even light physical activity can be beneficial.  Watch cholesterol and blood lipids  Have your blood tested for lipids and cholesterol at 50 years of age, then have this test every 5 years.  You may need to have your cholesterol levels checked more often if:  Your lipid or cholesterol levels are high.  You are older than 50 years of age.  You are at high risk for heart disease.  What should I know about cancer screening?  Many types of cancers can be detected early and may often be prevented. Depending on your health history and family history, you may need to have cancer screening at various ages. This may include screening for:  Colorectal cancer.  Prostate cancer.  Skin cancer.  Lung  cancer.  What should I know about heart disease, diabetes, and high blood pressure?  Blood pressure and heart disease  High blood pressure causes heart disease and increases the risk of stroke. This is more likely to develop in people who have high blood pressure readings or are overweight.  Talk with your health care provider about your target blood pressure readings.  Have your blood pressure checked:  Every 3-5 years if you are 9-95 years of age.  Every year if you are 85 years old or older.  If you are between the ages of 29 and 29 and are a current or former smoker, ask your health care provider if you should have a one-time screening for abdominal aortic aneurysm (AAA).  Diabetes  Have regular diabetes screenings. This checks your fasting blood sugar level. Have the screening done:  Once every three years after age 23 if you are at a normal weight and have a low risk for diabetes.  More often and at a younger age if you are overweight or have a high risk for diabetes.  What should I know about preventing infection?  Hepatitis B  If you have a higher risk for hepatitis B, you should be screened for this virus. Talk with your health care provider to find out if you are at risk for hepatitis B infection.  Hepatitis C  Blood testing is recommended for:  Everyone born from [birth year] through 1967.  Anyone  with known risk factors for hepatitis C.  Sexually transmitted infections (STIs)  You should be screened each year for STIs, including gonorrhea and chlamydia, if:  You are sexually active and are younger than 50 years of age.  You are older than 50 years of age and your health care provider tells you that you are at risk for this type of infection.  Your sexual activity has changed since you were last screened, and you are at increased risk for chlamydia or gonorrhea. Ask your health care provider if you are at risk.  Ask your health care provider about whether you are at high risk for HIV. Your health care provider  may recommend a prescription medicine to help prevent HIV infection. If you choose to take medicine to prevent HIV, you should first get tested for HIV. You should then be tested every 3 months for as long as you are taking the medicine.  Follow these instructions at home:  Alcohol use  Do not drink alcohol if your health care provider tells you not to drink.  If you drink alcohol:  Limit how much you have to 0-2 drinks a day.  Know how much alcohol is in your drink. In the [place] one drink equals one 12 oz bottle of beer (355 mL), one 5 oz glass of wine (148 mL), or one 1 oz glass of hard liquor (44 mL).  Lifestyle  Do not use any products that contain nicotine or tobacco. These products include cigarettes, chewing tobacco, and vaping devices, such as e-cigarettes. If you need help quitting, ask your health care provider.  Do not use street drugs.  Do not share needles.  Ask your health care provider for help if you need support or information about quitting drugs.  General instructions  Schedule regular health, dental, and eye exams.  Stay current with your vaccines.  Tell your health care provider if:  You often feel depressed.  You have ever been abused or do not feel safe at home.  Summary  Adopting a healthy lifestyle and getting preventive care are important in promoting health and wellness.  Follow your health care provider's instructions about healthy diet, exercising, and getting tested or screened for diseases.  Follow your health care provider's instructions on monitoring your cholesterol and blood pressure.  This information is not intended to replace advice given to you by your health care provider. Make sure you discuss any questions you have with your health care provider.  Document Revised: 04/14/2021 Document Reviewed: 04/14/2021  Elsevier Patient Education  2024 [employer].

## 2024-02-03 NOTE — Assessment & Plan Note (Signed)
 A full exam was performed.  DRE performed, stool is heme negative.  He is advised to get 30-45 minutes of regular exercise, no less than four to five days per week. Both weight-bearing and aerobic exercises are recommended.  He is advised to follow a healthy diet with at least six fruits/veggies per day, decrease intake of red meat and other saturated fats and to increase fish intake to twice weekly.  Meats/fish should not be fried -- baked, boiled or broiled is preferable. It is also important to cut back on your sugar intake.  Be sure to read labels - try to avoid anything with added sugar, high fructose corn syrup or other sweeteners.  If you must use a sweetener, you can try stevia or monkfruit.  It is also important to avoid artificially sweetened foods/beverages and diet drinks. Lastly, wear SPF 50 sunscreen on exposed skin and when in direct sunlight for an extended period of time.  Be sure to avoid fast food restaurants and aim for at least 60 ounces of water daily.

## 2024-02-03 NOTE — Assessment & Plan Note (Signed)
 BMI 43.  He is encouraged to initially strive for BMI less than 35 to decrease cardiac risk. He is advised to exercise no less than 150 minutes per week.

## 2024-02-03 NOTE — Assessment & Plan Note (Addendum)
 Chronic, I will check labs as below. We discussed switching to [facility], he may have better weight loss benefit. I will send rx 5mg  weekly.  We also discussed use of SGLT2i therapy for decreased risk of cardiac/renal complications. Possible side effects d/w patient. He was given samples of Jardiance 25mg  to take once daily.  Advised to take once daily and to stay well hydrated.  He will HOLD until I have reviewed his labs.  If we move forward with this, he agrees to rto in four weeks for re-evaluation. He will f/u in 3 months.   He will rto sooner if needed. I DISCUSSED WITH THE PATIENT AT LENGTH REGARDING THE GOALS OF GLYCEMIC CONTROL AND POSSIBLE LONG-TERM COMPLICATIONS.  I  ALSO STRESSED THE IMPORTANCE OF COMPLIANCE WITH HOME GLUCOSE MONITORING, DIETARY RESTRICTIONS INCLUDING AVOIDANCE OF SUGARY DRINKS/PROCESSED FOODS,  ALONG WITH REGULAR EXERCISE.  I  ALSO STRESSED THE IMPORTANCE OF ANNUAL EYE EXAMS, SELF FOOT CARE AND COMPLIANCE WITH OFFICE VISITS.

## 2024-02-04 LAB — CBC
Hematocrit: 46.8 % (ref 37.5–51.0)
Hemoglobin: 14.9 g/dL (ref 13.0–17.7)
MCH: 30.5 pg (ref 26.6–33.0)
MCHC: 31.8 g/dL (ref 31.5–35.7)
MCV: 96 fL (ref 79–97)
Platelets: 146 10*3/uL — ABNORMAL LOW (ref 150–450)
RBC: 4.88 x10E6/uL (ref 4.14–5.80)
RDW: 12.1 % (ref 11.6–15.4)
WBC: 6.4 10*3/uL (ref 3.4–10.8)

## 2024-02-04 LAB — CMP14+EGFR
ALT: 21 [IU]/L (ref 0–44)
AST: 19 [IU]/L (ref 0–40)
Albumin: 4.4 g/dL (ref 4.1–5.1)
Alkaline Phosphatase: 46 [IU]/L (ref 44–121)
BUN/Creatinine Ratio: 12 (ref 9–20)
BUN: 13 mg/dL (ref 6–24)
Bilirubin Total: 0.9 mg/dL (ref 0.0–1.2)
CO2: 23 mmol/L (ref 20–29)
Calcium: 9.5 mg/dL (ref 8.7–10.2)
Chloride: 102 mmol/L (ref 96–106)
Creatinine, Ser: 1.08 mg/dL (ref 0.76–1.27)
Globulin, Total: 3 g/dL (ref 1.5–4.5)
Glucose: 112 mg/dL — ABNORMAL HIGH (ref 70–99)
Potassium: 4.1 mmol/L (ref 3.5–5.2)
Sodium: 140 mmol/L (ref 134–144)
Total Protein: 7.4 g/dL (ref 6.0–8.5)
eGFR: 84 mL/min/{1.73_m2} (ref >59)

## 2024-02-04 LAB — POC HEMOCCULT BLD/STL (OFFICE/1-CARD/DIAGNOSTIC): Fecal Occult Blood, POC: NEGATIVE

## 2024-02-04 LAB — MICROALBUMIN / CREATININE URINE RATIO
Creatinine, Urine: 124 mg/dL
Microalb/Creat Ratio: 3 mg/g{creat} (ref 0–29)
Microalbumin, Urine: 3.3 ug/mL

## 2024-02-04 LAB — PSA: Prostate Specific Ag, Serum: 1.1 ng/mL (ref 0.0–4.0)

## 2024-02-04 LAB — HEMOGLOBIN A1C
Est. average glucose Bld gHb Est-mCnc: 166 mg/dL
Hgb A1c MFr Bld: 7.4 % — ABNORMAL HIGH (ref 4.8–5.6)

## 2024-02-04 LAB — LIPID PANEL
Chol/HDL Ratio: 3.3 {ratio} (ref 0.0–5.0)
Cholesterol, Total: 144 mg/dL (ref 100–199)
HDL: 44 mg/dL (ref >39)
LDL Chol Calc (NIH): 86 mg/dL (ref 0–99)
Triglycerides: 68 mg/dL (ref 0–149)
VLDL Cholesterol Cal: 14 mg/dL (ref 5–40)

## 2024-02-04 LAB — TSH: TSH: 1.75 u[IU]/mL (ref 0.450–4.500)

## 2024-02-11 ENCOUNTER — Encounter: Payer: Self-pay | Admitting: Internal Medicine

## 2024-02-16 ENCOUNTER — Telehealth: Payer: Self-pay

## 2024-02-16 NOTE — Telephone Encounter (Signed)
 ERROR

## 2024-03-06 ENCOUNTER — Telehealth: Payer: Self-pay

## 2024-03-06 NOTE — Telephone Encounter (Signed)
 Error.YL,RMA

## 2024-03-17 ENCOUNTER — Encounter: Payer: Self-pay | Admitting: Internal Medicine

## 2024-03-20 ENCOUNTER — Other Ambulatory Visit: Payer: Self-pay

## 2024-03-20 DIAGNOSIS — E785 Hyperlipidemia, unspecified: Secondary | ICD-10-CM

## 2024-03-20 MED ORDER — MOUNJARO 5 MG/0.5ML ~~LOC~~ SOAJ: 5.0000 mg | SUBCUTANEOUS | 0 refills | Status: DC

## 2024-05-09 ENCOUNTER — Ambulatory Visit: Payer: Self-pay | Admitting: Internal Medicine

## 2024-05-09 ENCOUNTER — Encounter: Payer: Self-pay | Admitting: Internal Medicine

## 2024-05-09 VITALS — BP 118/84 | HR 96 | Temp 97.8°F | Ht 72.0 in | Wt 320.2 lb

## 2024-05-09 DIAGNOSIS — D696 Thrombocytopenia, unspecified: Secondary | ICD-10-CM

## 2024-05-09 DIAGNOSIS — E785 Hyperlipidemia, unspecified: Secondary | ICD-10-CM

## 2024-05-09 DIAGNOSIS — M2550 Pain in unspecified joint: Secondary | ICD-10-CM

## 2024-05-09 DIAGNOSIS — E1169 Type 2 diabetes mellitus with other specified complication: Secondary | ICD-10-CM | POA: Diagnosis not present

## 2024-05-09 DIAGNOSIS — I10 Essential (primary) hypertension: Secondary | ICD-10-CM | POA: Diagnosis not present

## 2024-05-09 DIAGNOSIS — E66813 Obesity, class 3: Secondary | ICD-10-CM

## 2024-05-09 DIAGNOSIS — Z6841 Body Mass Index (BMI) 40.0 and over, adult: Secondary | ICD-10-CM

## 2024-05-09 MED ORDER — OZEMPIC (2 MG/DOSE) 8 MG/3ML ~~LOC~~ SOPN: 2.0000 mg | PEN_INJECTOR | SUBCUTANEOUS | 0 refills | Status: DC

## 2024-05-09 MED ORDER — MELOXICAM 15 MG PO TABS: ORAL_TABLET | ORAL | 1 refills | Status: DC

## 2024-05-09 NOTE — Progress Notes (Signed)
 I,[name], CMA,acting as a Neurosurgeon for Joel Dung, MD.,have documented all relevant documentation on the behalf of Joel Dung, MD,as directed by  Joel Dung, MD while in the presence of Joel Dung, MD.  Subjective:  Patient ID: Joel Wiley , male    DOB: [birth year] , 48 y.o.   MRN: [id number]  Chief Complaint  Patient presents with   Diabetes    Patient presents today for dm & bpc. He reports compliance with medications. Denies headache, chest pain & sob.   Hypertension    HPI Discussed the use of AI scribe software for clinical note transcription with the patient, who gave verbal consent to proceed.  History of Present Illness Joel Wiley is a 48 year old male with diabetes and hypertension who presents for a diabetes and blood pressure check.  He has been managing his diabetes with Ozempic , but due to insurance issues, he was unable to switch to Mounjaro  as planned. He attempted to get Mounjaro  approved multiple times, but the cost remained prohibitive at $1000. Consequently, he resumed Ozempic  at a dose of 0.5 mg, which he has been on for two weeks. His blood sugars have been running higher than usual, around 160 mg/dL, compared to his typical range of 115-120 mg/dL. He has not been eating optimally but denies consuming a lot of sugar. He has not been taking Jardiance  as prescribed, having only used the initial samples provided, and was unaware that he needed to continue it beyond the samples. He takes metformin  twice daily for diabetes management.  He has a history of hypertension and is currently taking lisinopril , though he has not been taking it consistently as he still has some left from a prescription that should have run out in February. He also takes atorvastatin  40 mg for cholesterol management.  He experiences joint pain, particularly in his shoulders and neck, which he attributes to poor posture while working from home. He has a history of  shoulder issues and was previously on meloxicam , which was discontinued. He has not been to a chiropractor recently but has considered returning for treatment.  He has a history of low platelets. He has not had any recent eye exams, though he has one scheduled soon.   Diabetes He presents for his follow-up diabetic visit. He has type 2 diabetes mellitus. His disease course has been improving. Pertinent negatives for diabetes include no blurred vision, no polydipsia, no polyphagia and no polyuria. There are no hypoglycemic complications. Risk factors for coronary artery disease include diabetes mellitus, dyslipidemia, hypertension, obesity and male sex. He participates in exercise intermittently. His breakfast blood glucose is taken between 8-9 am. His breakfast blood glucose range is generally 110-130 mg/dl. Eye exam is not current.  Hypertension This is a chronic problem. The current episode started more than 1 year ago. The problem has been gradually improving since onset. The problem is controlled. Pertinent negatives include no blurred vision. The current treatment provides moderate improvement.     Past Medical History:  Diagnosis Date   Diabetes (HCC)    High cholesterol    Hypertension    Morbid obesity with BMI of 40.0-44.9, adult (HCC)      Family History  Problem Relation Age of Onset   Hyperlipidemia Mother    Hypertension Mother    Diabetes Mother    Stroke Mother    Heart disease Father        heavy smoker   Hypertension Father  Hyperlipidemia Father      Current Outpatient Medications:    aspirin EC 81 MG tablet, Take 81 mg by mouth daily., Disp: , Rfl:    atorvastatin  (LIPITOR) 40 MG tablet, TAKE 1 TABLET BY MOUTH EVERY DAY, Disp: 90 tablet, Rfl: 3   empagliflozin  (JARDIANCE ) 25 MG TABS tablet, Take 1 tablet (25 mg total) by mouth daily before breakfast., Disp: , Rfl:    lisinopril  (ZESTRIL ) 5 MG tablet, TAKE 1 TABLET (5 MG TOTAL) BY MOUTH DAILY., Disp: 90 tablet,  Rfl: 2   metFORMIN  (GLUCOPHAGE ) 500 MG tablet, TAKE 1 TABLET BY MOUTH TWICE A DAY WITH FOOD, Disp: 180 tablet, Rfl: 2   Vitamin D , Ergocalciferol , (DRISDOL ) 1.25 MG (50000 UNIT) CAPS capsule, Take 1 capsule (50,000 Units total) by mouth every 7 (seven) days., Disp: 12 capsule, Rfl: 0   meloxicam  (MOBIC ) 15 MG tablet, One tab po qd prn, Disp: 30 tablet, Rfl: 1   Semaglutide , 2 MG/DOSE, (OZEMPIC , 2 MG/DOSE,) 8 MG/3ML SOPN, Inject 2 mg into the skin once a week., Disp: 9 mL, Rfl: 0   No Known Allergies   Review of Systems  Constitutional: Negative.   HENT: Negative.    Eyes:  Negative for blurred vision.  Respiratory: Negative.    Cardiovascular: Negative.   Gastrointestinal: Negative.   Endocrine: Negative for polydipsia, polyphagia and polyuria.  Skin: Negative.   Allergic/Immunologic: Negative.   Neurological: Negative.   Hematological: Negative.      Today's Vitals   05/09/24 1503  BP: 118/84  Pulse: 96  Temp: 97.8 F (36.6 C)  SpO2: 98%  Weight: (!) 320 lb 3.2 oz (145.2 kg)  Height: 6' (1.829 m)   Body mass index is 43.43 kg/m.  Wt Readings from Last 3 Encounters:  05/09/24 (!) 320 lb 3.2 oz (145.2 kg)  02/03/24 (!) 317 lb 3.2 oz (143.9 kg)  09/15/23 (!) 311 lb 12.8 oz (141.4 kg)     Objective:  Physical Exam Vitals and nursing note reviewed.  Constitutional:      Appearance: Normal appearance. He is obese.  HENT:     Head: Normocephalic and atraumatic.  Eyes:     Extraocular Movements: Extraocular movements intact.  Cardiovascular:     Rate and Rhythm: Normal rate and regular rhythm.     Heart sounds: Normal heart sounds.  Pulmonary:     Effort: Pulmonary effort is normal.     Breath sounds: Normal breath sounds.  Musculoskeletal:     Cervical back: Normal range of motion.  Skin:    General: Skin is warm.  Neurological:     General: No focal deficit present.     Mental Status: He is alert.  Psychiatric:        Mood and Affect: Mood normal.        Assessment And Plan:  Dyslipidemia due to type 2 diabetes mellitus (HCC) Assessment & Plan: Chronic, uncontrolled.  Elevated glucose at 160 mg/dL likely due to switch back to Ozempic  and diet. Discussed dosage and dietary improvements. Consider Synjardy if Jardiance  tolerated to reduce pill burden. - Renew Ozempic  2 mg prescription. - Restart Jardiance  25 mg, monitor kidney function in 4 weeks. - Consider Synjardy if Jardiance  tolerated. - Encourage dietary modifications.  Orders: -     CMP14+EGFR -     Hemoglobin A1c  Essential hypertension Assessment & Plan: Chronic, controlled.   He will continue with lisinopril  5mg  daily.  He is encouraged to follow a low sodium diet.   Orders: -  CMP14+EGFR  Thrombocytopenia (HCC) Assessment & Plan: I will recheck CBC today.   Orders: -     CBC  Arthralgia, unspecified joint Assessment & Plan: Increased shoulder and neck pain, likely from poor posture. Suggested posture correction and exercises. Discussed chiropractic care. - Renew meloxicam , advise taking with food. - Recommend posture correction and shoulder exercises. - Consider chiropractor referral.   Class 3 severe obesity due to excess calories with serious comorbidity and body mass index (BMI) of 40.0 to 44.9 in adult Assessment & Plan: BMI 43. He is encouraged to strive to lose ten percent of his body weight to decrease cardiac risk. Aim for at least 150 minutes of exercise per week.    Other orders -     Ozempic  (2 MG/DOSE); Inject 2 mg into the skin once a week.  Dispense: 9 mL; Refill: 0 -     Meloxicam ; One tab po qd prn  Dispense: 30 tablet; Refill: 1   Return in 4 weeks (on 06/06/2024), or Jardiance  f/u, for 4 month dm f/u.Aaron Aas  Patient was given opportunity to ask questions. Patient verbalized understanding of the plan and was able to repeat key elements of the plan. All questions were answered to their satisfaction.   I, Joel Dung, MD, have reviewed all  documentation for this visit. The documentation on 05/09/24 for the exam, diagnosis, procedures, and orders are all accurate and complete.   IF YOU HAVE BEEN REFERRED TO A SPECIALIST, IT MAY TAKE 1-2 WEEKS TO SCHEDULE/PROCESS THE REFERRAL. IF YOU HAVE NOT HEARD FROM US /SPECIALIST IN TWO WEEKS, PLEASE GIVE US  A CALL AT [phone].  THE PATIENT IS ENCOURAGED TO PRACTICE SOCIAL DISTANCING DUE TO THE COVID-19 PANDEMIC.

## 2024-05-09 NOTE — Patient Instructions (Signed)

## 2024-05-10 ENCOUNTER — Ambulatory Visit: Payer: Self-pay | Admitting: Internal Medicine

## 2024-05-10 LAB — CMP14+EGFR
ALT: 30 IU/L (ref 0–44)
AST: 22 IU/L (ref 0–40)
Albumin: 4.4 g/dL (ref 4.1–5.1)
Alkaline Phosphatase: 48 IU/L (ref 44–121)
BUN/Creatinine Ratio: 14 (ref 9–20)
BUN: 17 mg/dL (ref 6–24)
Bilirubin Total: 0.6 mg/dL (ref 0.0–1.2)
CO2: 22 mmol/L (ref 20–29)
Calcium: 9.9 mg/dL (ref 8.7–10.2)
Chloride: 101 mmol/L (ref 96–106)
Creatinine, Ser: 1.21 mg/dL (ref 0.76–1.27)
Globulin, Total: 3.1 g/dL (ref 1.5–4.5)
Glucose: 113 mg/dL — ABNORMAL HIGH (ref 70–99)
Potassium: 4.4 mmol/L (ref 3.5–5.2)
Sodium: 140 mmol/L (ref 134–144)
Total Protein: 7.5 g/dL (ref 6.0–8.5)
eGFR: 73 mL/min/{1.73_m2} (ref >59)

## 2024-05-10 LAB — HEMOGLOBIN A1C
Est. average glucose Bld gHb Est-mCnc: 206 mg/dL
Hgb A1c MFr Bld: 8.8 % — ABNORMAL HIGH (ref 4.8–5.6)

## 2024-05-10 LAB — CBC
Hematocrit: 46.9 % (ref 37.5–51.0)
Hemoglobin: 14.9 g/dL (ref 13.0–17.7)
MCH: 30.4 pg (ref 26.6–33.0)
MCHC: 31.8 g/dL (ref 31.5–35.7)
MCV: 96 fL (ref 79–97)
Platelets: 154 10*3/uL (ref 150–450)
RBC: 4.9 x10E6/uL (ref 4.14–5.80)
RDW: 11.9 % (ref 11.6–15.4)
WBC: 8.5 10*3/uL (ref 3.4–10.8)

## 2024-05-14 ENCOUNTER — Encounter: Payer: Self-pay | Admitting: Internal Medicine

## 2024-05-14 DIAGNOSIS — M2550 Pain in unspecified joint: Secondary | ICD-10-CM | POA: Insufficient documentation

## 2024-05-14 DIAGNOSIS — D696 Thrombocytopenia, unspecified: Secondary | ICD-10-CM | POA: Insufficient documentation

## 2024-05-14 NOTE — Assessment & Plan Note (Signed)
I will recheck CBC today

## 2024-05-14 NOTE — Assessment & Plan Note (Signed)
 BMI 43. He is encouraged to strive to lose ten percent of his body weight to decrease cardiac risk. Aim for at least 150 minutes of exercise per week.

## 2024-05-14 NOTE — Assessment & Plan Note (Signed)
 Increased shoulder and neck pain, likely from poor posture. Suggested posture correction and exercises. Discussed chiropractic care. - Renew meloxicam , advise taking with food. - Recommend posture correction and shoulder exercises. - Consider chiropractor referral.

## 2024-05-14 NOTE — Assessment & Plan Note (Signed)
 Chronic, uncontrolled.  Elevated glucose at 160 mg/dL likely due to switch back to Ozempic  and diet. Discussed dosage and dietary improvements. Consider Synjardy if Jardiance  tolerated to reduce pill burden. - Renew Ozempic  2 mg prescription. - Restart Jardiance  25 mg, monitor kidney function in 4 weeks. - Consider Synjardy if Jardiance  tolerated. - Encourage dietary modifications.

## 2024-05-14 NOTE — Assessment & Plan Note (Signed)
 Chronic, controlled.   He will continue with lisinopril  5mg  daily.  He is encouraged to follow a low sodium diet.

## 2024-06-07 ENCOUNTER — Encounter: Payer: Self-pay | Admitting: Internal Medicine

## 2024-06-07 ENCOUNTER — Ambulatory Visit: Admitting: Internal Medicine

## 2024-06-07 VITALS — BP 124/80 | HR 90 | Temp 98.3°F | Ht 72.0 in | Wt 308.8 lb

## 2024-06-07 DIAGNOSIS — E1169 Type 2 diabetes mellitus with other specified complication: Secondary | ICD-10-CM | POA: Diagnosis not present

## 2024-06-07 DIAGNOSIS — I10 Essential (primary) hypertension: Secondary | ICD-10-CM | POA: Diagnosis not present

## 2024-06-07 DIAGNOSIS — E785 Hyperlipidemia, unspecified: Secondary | ICD-10-CM | POA: Diagnosis not present

## 2024-06-07 MED ORDER — SYNJARDY XR 25-1000 MG PO TB24
1.0000 | ORAL_TABLET | Freq: Every day | ORAL | 1 refills | Status: DC
Start: 2024-06-07 — End: 2024-08-23

## 2024-06-07 NOTE — Patient Instructions (Signed)

## 2024-06-07 NOTE — Progress Notes (Signed)
 I,[name], CMA,acting as a Neurosurgeon for [name], MD.,have documented all relevant documentation on the behalf of [name], MD,as directed by  [name], MD while in the presence of [name], MD.  Subjective:  Patient ID: Joel Wiley , male    DOB: 1976-04-08 , 48 y.o.   MRN: [id number]  Chief Complaint  Patient presents with   Med Check    Patient presents today for Jardiance  follow up. He reports compliance with medication. Denies headache, chest pain & sob. He has not completed DM eye exam yet.    HPI Discussed the use of AI scribe software for clinical note transcription with the patient, who gave verbal consent to proceed.  History of Present Illness Joel Wiley is a 48 year old male with diabetes who presents for follow-up on Jardiance  therapy.  He has experienced an improvement in blood sugar levels since resuming Jardiance , with current levels ranging from 96 to 108 mg/dL. He confirms adherence to the medication regimen.  He has lost 12 pounds, attributing this to increased physical activity and dietary changes. He acknowledges that his diet is not yet optimal and is working on further improvements.  He has been using Synjardy  25/1000 mg, which he has just run out of, and he has a supply of metformin  available.  No issues with breathing. He drinks plenty of water.   Diabetes He presents for his follow-up diabetic visit. He has type 2 diabetes mellitus. His disease course has been improving. Pertinent negatives for diabetes include no polydipsia, no polyphagia and no polyuria. There are no hypoglycemic complications. Risk factors for coronary artery disease include diabetes mellitus, dyslipidemia, hypertension, obesity and male sex. He participates in exercise intermittently. His breakfast blood glucose is taken between 8-9 am. His breakfast blood glucose range is generally 110-130 mg/dl. Eye exam is not current.     Past Medical History:   Diagnosis Date   Diabetes (HCC)    High cholesterol    Hypertension    Morbid obesity with BMI of 40.0-44.9, adult (HCC)      Family History  Problem Relation Age of Onset   Hyperlipidemia Mother    Hypertension Mother    Diabetes Mother    Stroke Mother    Heart disease Father        heavy smoker   Hypertension Father    Hyperlipidemia Father      Current Outpatient Medications:    Empagliflozin -metFORMIN  HCl ER (SYNJARDY  XR) 25-1000 MG TB24, Take 1 tablet by mouth daily., Disp: 30 tablet, Rfl: 1   aspirin EC 81 MG tablet, Take 81 mg by mouth daily., Disp: , Rfl:    atorvastatin  (LIPITOR) 40 MG tablet, TAKE 1 TABLET BY MOUTH EVERY DAY, Disp: 90 tablet, Rfl: 3   lisinopril  (ZESTRIL ) 5 MG tablet, TAKE 1 TABLET (5 MG TOTAL) BY MOUTH DAILY., Disp: 90 tablet, Rfl: 2   meloxicam  (MOBIC ) 15 MG tablet, One tab po qd prn, Disp: 30 tablet, Rfl: 1   Semaglutide , 2 MG/DOSE, (OZEMPIC , 2 MG/DOSE,) 8 MG/3ML SOPN, Inject 2 mg into the skin once a week., Disp: 9 mL, Rfl: 0   Vitamin D , Ergocalciferol , (DRISDOL ) 1.25 MG (50000 UNIT) CAPS capsule, Take 1 capsule (50,000 Units total) by mouth every 7 (seven) days., Disp: 12 capsule, Rfl: 0   No Known Allergies   Review of Systems  Constitutional: Negative.   Respiratory: Negative.    Cardiovascular: Negative.   Gastrointestinal: Negative.   Endocrine: Negative.  Negative for polydipsia, polyphagia and polyuria.  Skin: Negative.   Allergic/Immunologic: Negative.   Hematological: Negative.      Today's Vitals   06/07/24 1613  BP: 124/80  Pulse: 90  Temp: 98.3 F (36.8 C)  SpO2: 98%  Weight: (!) 308 lb 12.8 oz (140.1 kg)  Height: 6' (1.829 m)   Body mass index is 41.88 kg/m.  Wt Readings from Last 3 Encounters:  06/07/24 (!) 308 lb 12.8 oz (140.1 kg)  05/09/24 (!) 320 lb 3.2 oz (145.2 kg)  02/03/24 (!) 317 lb 3.2 oz (143.9 kg)     Objective:  Physical Exam Vitals and nursing note reviewed.  Constitutional:      Appearance:  Normal appearance. He is obese.  HENT:     Head: Normocephalic and atraumatic.  Eyes:     Extraocular Movements: Extraocular movements intact.  Cardiovascular:     Rate and Rhythm: Normal rate and regular rhythm.     Heart sounds: Normal heart sounds.  Pulmonary:     Effort: Pulmonary effort is normal.     Breath sounds: Normal breath sounds.  Musculoskeletal:     Cervical back: Normal range of motion.  Skin:    General: Skin is warm.  Neurological:     General: No focal deficit present.     Mental Status: He is alert.  Psychiatric:        Mood and Affect: Mood normal.      Assessment And Plan:  Dyslipidemia due to type 2 diabetes mellitus (HCC) Assessment & Plan: Restarted on Jardiance  with improved glucose levels and weight loss. Plan to switch to Synjardy  for regimen simplification and glycemic control. - Send Synjardy  prescription to pharmacy. - Use leftover metformin  until Synjardy  is available.  Orders: -     BMP8+EGFR  Essential hypertension Assessment & Plan: Chronic, controlled.   He will continue with lisinopril  5mg  daily.  He is encouraged to follow a low sodium diet.    Other orders -     Synjardy  XR; Take 1 tablet by mouth daily.  Dispense: 30 tablet; Refill: 1   Return if symptoms worsen or fail to improve.  Patient was given opportunity to ask questions. Patient verbalized understanding of the plan and was able to repeat key elements of the plan. All questions were answered to their satisfaction.    I, [name], MD, have reviewed all documentation for this visit. The documentation on 06/07/24 for the exam, diagnosis, procedures, and orders are all accurate and complete.   IF YOU HAVE BEEN REFERRED TO A SPECIALIST, IT MAY TAKE 1-2 WEEKS TO SCHEDULE/PROCESS THE REFERRAL. IF YOU HAVE NOT HEARD FROM US /SPECIALIST IN TWO WEEKS, PLEASE GIVE US  A CALL AT [phone].   THE PATIENT IS ENCOURAGED TO PRACTICE SOCIAL DISTANCING DUE TO THE COVID-19  PANDEMIC.

## 2024-06-08 LAB — BMP8+EGFR
BUN/Creatinine Ratio: 13 (ref 9–20)
BUN: 16 mg/dL (ref 6–24)
CO2: 22 mmol/L (ref 20–29)
Calcium: 10.1 mg/dL (ref 8.7–10.2)
Chloride: 102 mmol/L (ref 96–106)
Creatinine, Ser: 1.2 mg/dL (ref 0.76–1.27)
Glucose: 93 mg/dL (ref 70–99)
Potassium: 4.4 mmol/L (ref 3.5–5.2)
Sodium: 141 mmol/L (ref 134–144)
eGFR: 74 mL/min/{1.73_m2} (ref >59)

## 2024-06-10 ENCOUNTER — Ambulatory Visit: Payer: Self-pay | Admitting: Internal Medicine

## 2024-06-11 NOTE — Assessment & Plan Note (Signed)
 Chronic, controlled.   He will continue with lisinopril  5mg  daily.  He is encouraged to follow a low sodium diet.

## 2024-06-11 NOTE — Assessment & Plan Note (Signed)
 Restarted on Jardiance  with improved glucose levels and weight loss. Plan to switch to Synjardy  for regimen simplification and glycemic control. - Send Synjardy  prescription to pharmacy. - Use leftover metformin  until Synjardy  is available.

## 2024-06-12 ENCOUNTER — Encounter: Payer: Self-pay | Admitting: Pharmacist

## 2024-06-12 ENCOUNTER — Telehealth: Payer: Self-pay | Admitting: Pharmacist

## 2024-06-12 DIAGNOSIS — I10 Essential (primary) hypertension: Secondary | ICD-10-CM

## 2024-06-12 DIAGNOSIS — E1165 Type 2 diabetes mellitus with hyperglycemia: Secondary | ICD-10-CM

## 2024-06-12 MED ORDER — LISINOPRIL 5 MG PO TABS: 5.0000 mg | ORAL_TABLET | Freq: Every day | ORAL | 2 refills | Status: DC

## 2024-06-12 NOTE — Progress Notes (Signed)
 06/12/2024 Name: Joel Wiley MRN: [id number] DOB: 1976/02/27  Chief Complaint  Patient presents with   Medication Management    Diabetes-TNM    Joel Wiley is a 48 y.o. year old male who presented for a telephone visit.   They were referred to the pharmacist by a quality report for assistance in managing diabetes. TNM Report   Subjective: Joel Wiley is a 48 year old male who was on the [facility]NM diabetes report because his HgA1c increased to 8.8% from 7.4%.  Patient has multiple medical conditions including but not limited to:  uncontrolled type 2 diabetes, hyperlipidemia, hypertension, and tension headaches.  Care Team: Primary Care Provider: Jarold Medici, MD ; Next Scheduled Visit: 08/23/24  Medication Access/Adherence  Current Pharmacy:  [phone] [address], [place] - [address] [place] [address] Phone: [phone] Fax: [phone]   Patient reports affordability concerns with their medications: No  Patient reports access/transportation concerns to their pharmacy: No  Patient reports adherence concerns with their medications:  Yes  Mounjaro  was not covered by insurance. Recently switched back to Ozempic    Diabetes:  Current medications:  Synjardy  XR 25-1000 Ozempic  2 mg weekly  Current glucose readings: 102 mg/dl this morning Patient denies hypoglycemic s/sx including  dizziness, shakiness, sweating. Patient denies hyperglycemic symptoms including  polyuria, polydipsia, polyphagia, nocturia, neuropathy, blurred vision.  Current meal patterns:  just started meal prepping - Breakfast: eggs, grits, salmon, [place] sausage - Lunch/Supper  broccoli, cauliflower, chicken, beef, salmon -- Snacks kit kat, pound cake - Drinks coffee (black), water, sparkling water drinks  Current physical activity: stationary bike, walking, free weights  Current medication access support: has Nurse, learning disability. Copay for medications is $0 per  [facility] from today.   Hypertension:  Current medications:   Lisinopril  5 mg  Hyperlipidemia/ASCVD Risk Reduction  Current lipid lowering medications:  Atorvastatin  40 mg  Antiplatelet regimen:  Aspirin 81 mg   Clinical ASCVD: No  The 10-year ASCVD risk score (Arnett DK, et al., 2019) is: 14.4%   Values used to calculate the score:     Age: 30 years     Clincally relevant sex: Male     Is Non-Hispanic African American: Yes     Diabetic: Yes     Tobacco smoker: No     Systolic Blood Pressure: 124 mmHg     Is BP treated: Yes     HDL Cholesterol: 44 mg/dL     Total Cholesterol: 144 mg/dL    Objective:  Lab Results  Component Value Date   HGBA1C 8.8 (H) 05/09/2024    Lab Results  Component Value Date   CREATININE 1.20 06/07/2024   BUN 16 06/07/2024   NA 141 06/07/2024   K 4.4 06/07/2024   CL 102 06/07/2024   CO2 22 06/07/2024    Lab Results  Component Value Date   CHOL 144 02/03/2024   HDL 44 02/03/2024   LDLCALC 86 02/03/2024   TRIG 68 02/03/2024   CHOLHDL 3.3 02/03/2024    Medications Reviewed Today     Reviewed by Jolee Cassius PARAS, RPH (Pharmacist) on 06/12/24 at 1602  Med List Status: <None>   Medication Order Taking? Sig Documenting Provider Last Dose Status Informant  aspirin EC 81 MG tablet [id number] Yes Take 81 mg by mouth daily. [provider]  Active   atorvastatin  (LIPITOR) 40 MG tablet [id number] Yes TAKE 1 TABLET BY MOUTH EVERY DAY Jarold Medici, MD  Active   Empagliflozin -metFORMIN  HCl ER (SYNJARDY   XR) 25-1000 MG TB24 [id number] Yes Take 1 tablet by mouth daily. Jarold Medici, MD  Active   lisinopril  (ZESTRIL ) 5 MG tablet [id number] Yes TAKE 1 TABLET (5 MG TOTAL) BY MOUTH DAILY. Jarold Medici, MD  Active   meloxicam  (MOBIC ) 15 MG tablet [id number] Yes One tab po qd prn Jarold Medici, MD  Active   Semaglutide , 2 MG/DOSE, (OZEMPIC , 2 MG/DOSE,) 8 MG/3ML SOPN [id number] Yes Inject 2 mg into the skin once a week. Jarold Medici, MD  Active    Vitamin D , Ergocalciferol , (DRISDOL ) 1.25 MG (50000 UNIT) CAPS capsule [id number] Yes Take 1 capsule (50,000 Units total) by mouth every 7 (seven) days. Jarold Medici, MD  Active               Assessment/Plan:   Diabetes: - Currently uncontrolled A1c 8.8% - Reviewed long term cardiovascular and renal outcomes of uncontrolled blood sugar - Reviewed goal A1c, goal fasting, and goal 2 hour post prandial glucose - Reviewed dietary modifications including increasing protein, decreasing simple carbs (kitkats and pound cake particularly) - Reviewed lifestyle modifications including: increasing steps taken daily - Patient denies personal or family history of multiple endocrine neoplasia type 2, medullary thyroid  cancer; personal history of pancreatitis or gallbladder disease. - Recommend to check glucose at least twice per day - Recommend to remain on current therapy. Patient had a pause in therapy due to insurance coverage    Hypertension: - Currently controlled124/80 - Recommend to continue current therapy -Refill sent for Lisinopril     Hyperlipidemia/ASCVD Risk Reduction: - Currently controlled.  LDL 86 mg/dl - Reviewed long term complications of uncontrolled cholesterol - Recommend to continue current therapy-Atorvastatin  40 mg last filled 04/30/24  Follow Up Plan:    Call Patient back in 1 week to check blood sugars.   [name], PharmD, BCACP Clinical Pharmacist [phone]

## 2024-06-19 ENCOUNTER — Telehealth: Payer: Self-pay | Admitting: Pharmacist

## 2024-06-19 DIAGNOSIS — E1165 Type 2 diabetes mellitus with hyperglycemia: Secondary | ICD-10-CM

## 2024-06-19 NOTE — Progress Notes (Signed)
   06/19/2024  Patient ID: Joel Wiley, male   DOB: 1976-04-24, 48 y.o.   MRN: [id number]  Called Patient to follow up on blood sugars. HIPAA identifiers were obtained.  Patient is a 48 year old male who was on the TNM diabetes report because his HgA1c increased to 8.8% from 7.4%. Patient has multiple medical conditions including but not limited to: uncontrolled type 2 diabetes, hyperlipidemia, hypertension, and tension headaches.   The purpose of today's call was to follow up on his blood sugars and touch bases on his food choices.  Patient reported that he loss two pounds.  He also said he did not have a Kit Kat or a piece of pound cake since we talked.    He reported his blood sugars as 93-109 in the mornings and nothing over 180 mg/dl all day.    He said his snacks have been watermelon and almonds and that he stops eating a 7:30 pm.    Current diabetes therapy:  Synjardy  XR 25-1000 1 tablet daily filled 07/02 Ozempic  2 mg weekly-filled    On statin:  Atorvastatin  40 mg 1 tablet daily-last filled 04/30/24 90 day supply LDL-86mg /dl  Lisinopril  5 mg 1 tablet daily last filled 06/12/24 #90  Plan:  Follow up with Patient in 1 month. Upcoming PCP appointment 08/23/24  [name], PharmD, BCACP Clinical Pharmacist [phone]

## 2024-07-08 ENCOUNTER — Other Ambulatory Visit: Payer: Self-pay | Admitting: Internal Medicine

## 2024-07-11 ENCOUNTER — Telehealth: Payer: Self-pay | Admitting: Pharmacy Technician

## 2024-07-11 NOTE — Progress Notes (Signed)
   07/11/2024  Patient ID: Christepher Kernen, male   DOB: 07/23/1976, 48 y.o.   MRN: [id number]  Patient engaged with clinical pharmacist for management of diabetes on 06/19/2024. Outreach by [employer] technician was requested.   Outreached patient to discuss diabetes medication management. Left voicemail for patient to return my call at their convenience.    Kean Gautreau, CPhT [facility] Office: [phone] Email: [email or web address]

## 2024-07-13 ENCOUNTER — Telehealth: Payer: Self-pay | Admitting: Pharmacy Technician

## 2024-07-13 NOTE — Progress Notes (Signed)
   07/13/2024  Patient ID: Joel Wiley, male   DOB: 1976/04/16, 48 y.o.   MRN: [id number]  Patient engaged with clinical pharmacist for management of diabetes on 06/19/2024. 2nd outreach by [facility] was requested.   Outreached patient to discuss diabetes medication management. Left voicemail for patient to return my call at their convenience.   Kerrianne Jeng, CPhT [facility] Office: [phone] Email: [email or web address]

## 2024-07-17 ENCOUNTER — Telehealth: Payer: Self-pay | Admitting: Pharmacy Technician

## 2024-07-17 ENCOUNTER — Telehealth: Payer: Self-pay | Admitting: Pharmacist

## 2024-07-17 DIAGNOSIS — E1165 Type 2 diabetes mellitus with hyperglycemia: Secondary | ICD-10-CM

## 2024-07-17 NOTE — Progress Notes (Signed)
   07/17/2024  Patient ID: Joel Wiley, male   DOB: 04-01-1976, 48 y.o.   MRN: [id number]  Patient was called to follow up on diabetes management. HIPAA identifiers were obtained.  Patient is a 48 year old male who was on the [facility] diabetes report because his HgA1c increased to 8.8% from 7.4%. Patient has multiple medical conditions including but not limited to: uncontrolled type 2 diabetes, hyperlipidemia, hypertension, and tension headaches.    The purpose of today's call was to follow up on his blood sugars.    Patient reported his blood sugars have been ranging between 130-150s.  He said he has only had one blood sugar over 250 mg/dl and that was because he ate two bowls of spaghetti.      Current diabetes therapy:   Synjardy  XR 25-1000 1 tablet daily filled 06/07/24 -reported the pharmacy had to order it and he will pick up ASAP Ozempic  2 mg weekly-filled 06/26/24    On statin:  Atorvastatin  40 mg 1 tablet daily-last filled 04/30/24 90 day supply LDL-86mg /dl   Lisinopril  5 mg 1 tablet daily last filled 06/12/24 #90   Plan: Follow up with Patient after his PCP appointment on 08/23/24  [name], PharmD, BCACP Clinical Pharmacist [phone]

## 2024-07-17 NOTE — Progress Notes (Signed)
   07/17/2024  Patient ID: Joel Wiley, male   DOB: 09-14-1976, 48 y.o.   MRN: [id number]  Patient engaged with clinical pharmacist for management of diabetes on 06/19/2024. Outreach by [employer] technician was requested.   Outreached patient to discuss diabetes medication management. Left voicemail for patient to return my call at their convenience. Mychart message sent to patient today.  Kareena Arrambide, CPhT [facility] Office: [phone] Email: [email or web address]

## 2024-08-23 ENCOUNTER — Ambulatory Visit: Admitting: Internal Medicine

## 2024-08-23 VITALS — BP 110/82 | HR 91 | Temp 98.3°F | Ht 72.0 in | Wt 298.6 lb

## 2024-08-23 DIAGNOSIS — I10 Essential (primary) hypertension: Secondary | ICD-10-CM | POA: Diagnosis not present

## 2024-08-23 DIAGNOSIS — E66813 Obesity, class 3: Secondary | ICD-10-CM | POA: Diagnosis not present

## 2024-08-23 DIAGNOSIS — E785 Hyperlipidemia, unspecified: Secondary | ICD-10-CM

## 2024-08-23 DIAGNOSIS — Z6841 Body Mass Index (BMI) 40.0 and over, adult: Secondary | ICD-10-CM

## 2024-08-23 DIAGNOSIS — Z23 Encounter for immunization: Secondary | ICD-10-CM

## 2024-08-23 DIAGNOSIS — E1169 Type 2 diabetes mellitus with other specified complication: Secondary | ICD-10-CM

## 2024-08-23 MED ORDER — SYNJARDY XR 25-1000 MG PO TB24: 1.0000 | ORAL_TABLET | Freq: Every day | ORAL | 1 refills | Status: DC

## 2024-08-23 NOTE — Patient Instructions (Signed)

## 2024-08-23 NOTE — Progress Notes (Signed)
 I,[name], CMA,acting as a Neurosurgeon for [name], MD.,have documented all relevant documentation on the behalf of [name], MD,as directed by  [name], MD while in the presence of [name], MD.  Subjective:  Patient ID: Joel Wiley , male    DOB: Oct 14, 1976 , 48 y.o.   MRN: [id number]  Chief Complaint  Patient presents with   Diabetes    Patient presents today for dm & bpc. He reports compliance with medications. Denies headache, chest pain & sob.   Hypertension    HPI Discussed the use of AI scribe software for clinical note transcription with the patient, who gave verbal consent to proceed.  History of Present Illness Joel Wiley is a 48 year old male with diabetes and hypertension who presents for management of blood sugar and blood pressure.  His blood sugar levels have been ranging between 120 to 130 mg/dL, with occasional drops into the 90s, during which he feels slightly weaker but not significantly unwell. He is currently taking Synjardy  25/1000 mg once daily and Ozempic .  Hypertension is managed with lisinopril  5 mg daily. He mentions a weight loss of 10 pounds since July and a total of 32 pounds since June, attributing some of the previous weight gain to being off medication.  He is taking atorvastatin  40 mg and aspirin as part of his regimen. He consumes about 64 ounces of water daily and aims to increase this to 80-100 ounces.  He recently received a shingles vaccine and plans to get a flu shot in the coming weeks. He is preparing for a trip to [place] in late October and is working on improving his health metrics before the trip.   Diabetes He presents for his follow-up diabetic visit. He has type 2 diabetes mellitus. His disease course has been improving. Pertinent negatives for diabetes include no blurred vision, no polydipsia, no polyphagia and no polyuria. There are no hypoglycemic complications. Risk factors for coronary artery  disease include diabetes mellitus, dyslipidemia, hypertension, obesity and male sex. He participates in exercise intermittently. His breakfast blood glucose is taken between 8-9 am. His breakfast blood glucose range is generally 110-130 mg/dl. Eye exam is not current.  Hypertension This is a chronic problem. The current episode started more than 1 year ago. The problem has been gradually improving since onset. The problem is controlled. Pertinent negatives include no blurred vision. Past treatments include ACE inhibitors. The current treatment provides moderate improvement.     Past Medical History:  Diagnosis Date   Diabetes (HCC)    High cholesterol    Hypertension    Morbid obesity with BMI of 40.0-44.9, adult (HCC)      Family History  Problem Relation Age of Onset   Hyperlipidemia Mother    Hypertension Mother    Diabetes Mother    Stroke Mother    Heart disease Father        heavy smoker   Hypertension Father    Hyperlipidemia Father      Current Outpatient Medications:    aspirin EC 81 MG tablet, Take 81 mg by mouth daily., Disp: , Rfl:    atorvastatin  (LIPITOR) 40 MG tablet, TAKE 1 TABLET BY MOUTH EVERY DAY, Disp: 90 tablet, Rfl: 3   Empagliflozin -metFORMIN  HCl ER (SYNJARDY  XR) 25-1000 MG TB24, Take 1 tablet by mouth daily., Disp: 30 tablet, Rfl: 1   lisinopril  (ZESTRIL ) 5 MG tablet, Take 1 tablet (5 mg total) by mouth daily., Disp: 90 tablet,  Rfl: 2   Semaglutide , 2 MG/DOSE, (OZEMPIC , 2 MG/DOSE,) 8 MG/3ML SOPN, Inject 2 mg into the skin once a week., Disp: 9 mL, Rfl: 0   No Known Allergies   Review of Systems  Constitutional: Negative.   HENT: Negative.    Eyes:  Negative for blurred vision.  Respiratory: Negative.    Cardiovascular: Negative.   Gastrointestinal: Negative.   Endocrine: Negative for polydipsia, polyphagia and polyuria.  Skin: Negative.   Allergic/Immunologic: Negative.   Neurological: Negative.   Hematological: Negative.      Today's Vitals    08/23/24 1505  BP: 110/82  Pulse: 91  Temp: 98.3 F (36.8 C)  SpO2: 98%  Weight: 298 lb 9.6 oz (135.4 kg)  Height: 6' (1.829 m)   Body mass index is 40.5 kg/m.  Wt Readings from Last 3 Encounters:  08/23/24 298 lb 9.6 oz (135.4 kg)  06/07/24 (!) 308 lb 12.8 oz (140.1 kg)  05/09/24 (!) 320 lb 3.2 oz (145.2 kg)     Objective:  Physical Exam Vitals and nursing note reviewed.  Constitutional:      Appearance: Normal appearance. He is obese.  HENT:     Head: Normocephalic and atraumatic.  Eyes:     Extraocular Movements: Extraocular movements intact.  Cardiovascular:     Rate and Rhythm: Normal rate and regular rhythm.     Heart sounds: Normal heart sounds.  Pulmonary:     Effort: Pulmonary effort is normal.     Breath sounds: Normal breath sounds.  Musculoskeletal:     Cervical back: Normal range of motion.  Skin:    General: Skin is warm.  Neurological:     General: No focal deficit present.     Mental Status: He is alert.  Psychiatric:        Mood and Affect: Mood normal.         Assessment And Plan:  Dyslipidemia due to type 2 diabetes mellitus (HCC) Assessment & Plan: Restarted on Jardiance  with improved glucose levels and weight loss. A1c target <8% by year-end. - Refill Synjardy  25/1000 mg once daily. - Check A1c level. - Continue aspirin, atorvastatin  40 mg, lisinopril  5 mg, Ozempic  2 mg. - Encourage water intake 80-100 oz daily. - Advise against Crystal Light and other artificially sweetened beverages.   Orders: -     Lipid panel -     Hemoglobin A1c  Essential hypertension Assessment & Plan: Chronic, controlled.   He will continue with lisinopril  5mg  daily.  He is encouraged to follow a low sodium diet.   Orders: -     Lipid panel  Class 3 severe obesity due to excess calories with serious comorbidity and body mass index (BMI) of 40.0 to 44.9 in adult Assessment & Plan: BMI 40.  He was congratulated on his 10lb weight loss since July 2025. He  is encouraged to strive to lose ten percent of his body weight to decrease cardiac risk.    Need for shingles vaccine -     Varicella-zoster vaccine IM  Other orders -     Synjardy  XR; Take 1 tablet by mouth daily.  Dispense: 30 tablet; Refill: 1   Return in 16 days (on 09/08/2024), or [place] - flu shot, for 4 MONTH DM F/U. 4 month shingles second vaccine.  Patient was given opportunity to ask questions. Patient verbalized understanding of the plan and was able to repeat key elements of the plan. All questions were answered to their satisfaction.   I, Catheryn  LOISE Slocumb, MD, have reviewed all documentation for this visit. The documentation on 08/24/24 for the exam, diagnosis, procedures, and orders are all accurate and complete.   IF YOU HAVE BEEN REFERRED TO A SPECIALIST, IT MAY TAKE 1-2 WEEKS TO SCHEDULE/PROCESS THE REFERRAL. IF YOU HAVE NOT HEARD FROM US /SPECIALIST IN TWO WEEKS, PLEASE GIVE US  A CALL AT [phone].   THE PATIENT IS ENCOURAGED TO PRACTICE SOCIAL DISTANCING DUE TO THE COVID-19 PANDEMIC.

## 2024-08-24 ENCOUNTER — Encounter: Payer: Self-pay | Admitting: Internal Medicine

## 2024-08-24 LAB — LIPID PANEL
Chol/HDL Ratio: 3.2 ratio (ref 0.0–5.0)
Cholesterol, Total: 139 mg/dL (ref 100–199)
HDL: 44 mg/dL (ref >39)
LDL Chol Calc (NIH): 72 mg/dL (ref 0–99)
Triglycerides: 128 mg/dL (ref 0–149)
VLDL Cholesterol Cal: 23 mg/dL (ref 5–40)

## 2024-08-24 LAB — HEMOGLOBIN A1C
Est. average glucose Bld gHb Est-mCnc: 143 mg/dL
Hgb A1c MFr Bld: 6.6 % — ABNORMAL HIGH (ref 4.8–5.6)

## 2024-08-24 NOTE — Assessment & Plan Note (Signed)
 Chronic, controlled.   He will continue with lisinopril  5mg  daily.  He is encouraged to follow a low sodium diet.

## 2024-08-24 NOTE — Assessment & Plan Note (Signed)
 Restarted on Jardiance  with improved glucose levels and weight loss. A1c target <8% by year-end. - Refill Synjardy  25/1000 mg once daily. - Check A1c level. - Continue aspirin, atorvastatin  40 mg, lisinopril  5 mg, Ozempic  2 mg. - Encourage water intake 80-100 oz daily. - Advise against Crystal Light and other artificially sweetened beverages.

## 2024-08-24 NOTE — Assessment & Plan Note (Signed)
 BMI 40.  He was congratulated on his 10lb weight loss since July 2025. He is encouraged to strive to lose ten percent of his body weight to decrease cardiac risk.

## 2024-08-26 ENCOUNTER — Ambulatory Visit: Payer: Self-pay | Admitting: Internal Medicine

## 2024-09-08 ENCOUNTER — Ambulatory Visit

## 2024-09-08 NOTE — Progress Notes (Signed)
 Joel Wiley                                          MRN: [id number]   09/08/2024   The VBCI Quality Team Specialist reviewed this patient medical record for the purposes of chart review for care gap closure. The following were reviewed: abstraction for care gap closure-glycemic status assessment.    VBCI Quality Team

## 2024-09-10 ENCOUNTER — Encounter: Payer: Self-pay | Admitting: Internal Medicine

## 2024-09-11 ENCOUNTER — Ambulatory Visit: Payer: Self-pay | Admitting: Internal Medicine

## 2024-09-12 ENCOUNTER — Other Ambulatory Visit: Payer: Self-pay | Admitting: Internal Medicine

## 2024-09-29 ENCOUNTER — Telehealth: Payer: Self-pay | Admitting: Pharmacy Technician

## 2024-09-29 NOTE — Progress Notes (Signed)
   09/29/2024 Name: Joel Wiley MRN: [id number] DOB: 29-Mar-1976  Patient is appearing for a follow-up visit with the population health pharmacy technician. Last engaged with the clinical pharmacist to discuss diabetes on 07/17/2024. Contacted patient today to discuss diabetes.   Plan from last clinical pharmacist appointment:  Plan: Follow up with Patient after his PCP appointment on 08/23/24    Medication Adherence Barriers Identified:  Patient made recommended medication changes per plan: Yes Patient informs he takes Synjardy  25/1000mg  daily, Atorvastatin  40mg  daily, Lisinopril  5mg  daily and Ozempic  2mg  weekly. He informs he has medications on hand to take and is adherent to medication regimen Access issues with any new medication or testing device: No Patients no affordability or access concerns. Per Dr Annemarie fill history data, Ozempic  last filled on 10/6 for 1 month supply, Synjardy  on 9/20 for 1 month supply (reminded about refill due), Lisinopril  for 48  tablets on 10/6 and Atorvastatin  90 day supply on 8/25. Patient is checking blood sugars as prescribed: Yes Patient informs he is checking blood sugar twice a day in the morning and the evenings. He informs it ranges from the 90s in the morning before breakfast  up to 120 in the evenings after eating. Labs were checked on 08/23/2024 and A1C was 6.6 and cholesterol labs had improved.   Medication Adherence Barriers Addressed/Actions Taken:  Reviewed medication changes per plan from last clinical pharmacist note Educated patient to contact pharmacy regarding new prescriptions and refills Reviewed instructions for monitoring blood sugars at home and reminded patient to keep a written log to review with pharmacist Reminded patient of date/time of upcoming clinical pharmacist follow up and any upcoming PCP/specialists visits. Patient denies transportation barriers to the appointment. No  Next clinical pharmacist appointment is scheduled  for: TBD  Kate Caddy, CPhT [facility] Office: [phone] Email: [name]

## 2024-10-25 ENCOUNTER — Other Ambulatory Visit: Payer: Self-pay | Admitting: Internal Medicine

## 2024-11-26 ENCOUNTER — Other Ambulatory Visit: Payer: Self-pay | Admitting: Internal Medicine

## 2024-12-24 ENCOUNTER — Other Ambulatory Visit: Payer: Self-pay | Admitting: Internal Medicine

## 2025-01-08 ENCOUNTER — Encounter: Payer: Self-pay | Admitting: Internal Medicine

## 2025-01-08 ENCOUNTER — Telehealth: Admitting: Internal Medicine

## 2025-01-08 DIAGNOSIS — E785 Hyperlipidemia, unspecified: Secondary | ICD-10-CM

## 2025-01-08 DIAGNOSIS — I10 Essential (primary) hypertension: Secondary | ICD-10-CM

## 2025-01-08 MED ORDER — SYNJARDY XR 25-1000 MG PO TB24: 1.0000 | ORAL_TABLET | Freq: Every day | ORAL | 2 refills | Status: AC

## 2025-01-08 MED ORDER — ATORVASTATIN CALCIUM 40 MG PO TABS: 40.0000 mg | ORAL_TABLET | Freq: Every day | ORAL | 3 refills | Status: AC

## 2025-01-08 MED ORDER — LISINOPRIL 5 MG PO TABS: 5.0000 mg | ORAL_TABLET | Freq: Every day | ORAL | 2 refills | Status: AC

## 2025-01-08 NOTE — Patient Instructions (Signed)

## 2025-01-08 NOTE — Assessment & Plan Note (Signed)
 Chronic, he will continue with lisinopril  5mg  daily.  He is encouraged to follow a low sodium diet.

## 2025-01-08 NOTE — Assessment & Plan Note (Signed)
 Blood glucose levels well-controlled. Reports symptoms at lower glucose levels. Using fingerstick monitoring. - Scheduled lab work for liver, kidney function, and A1c next Monday at 8:40 AM. - Provided Dexcom G7 and Freestyle Libre 3 sensors for trial during next week's lab visit. - Instructed to [employer] 3 Plus app and Dexcom G7 app. - Continue current diabetes medications: Ozempic  and Synjardy . - Continue with atorvastatin  40mg  daily

## 2025-01-08 NOTE — Progress Notes (Signed)
 "  Virtual Visit via Video Note  I,[name], CMA,acting as a neurosurgeon for Joel LOISE Slocumb, MD.,have documented all relevant documentation on the behalf of Joel LOISE Slocumb, MD,as directed by  Joel LOISE Slocumb, MD while in the presence of Joel LOISE Slocumb, MD.  I connected with Joel Wiley on 01/08/25 at 12:00 PM EST by a video enabled telemedicine application and verified that I am speaking with the correct person using two identifiers.  Patient Location: Home Provider Location: Home Office  I discussed the limitations, risks, security, and privacy concerns of performing an evaluation and management service by video and the availability of in person appointments. I also discussed with the patient that there may be a patient responsible charge related to this service. The patient expressed understanding and agreed to proceed.  Subjective: PCP: Wiley Catheryn, MD  Chief Complaint  Patient presents with   Diabetes    Patient presents today for dm & bp follow up. He reports compliance with medications. Denies headache, chest pain & sob.    Hypertension    Discussed the use of AI scribe software for clinical note transcription with the patient, who gave verbal consent to proceed.  History of Present Illness Joel Wiley is a 49 year old male with diabetes who presents for a virtual follow-up visit to discuss lab work scheduling and medication management.  He manages his diabetes with a regimen that includes Ozempic , Synjardy , lisinopril , atorvastatin , and aspirin. Blood sugar levels range between 87 and 101 mg/dL. At the lower end, he sometimes feels hungry or experiences a sensation of heat, which he associates with lower blood sugar levels. He currently monitors his blood sugar by checking his finger.  He has recently received his second shingles vaccine, Shingrix , on December 26, 2024, at a local [facility] pharmacy.  He engages in regular physical activity, exercising three times a  week. His routine includes 30 minutes of cardio and light weightlifting to avoid joint strain. He reports feeling good and has noticed positive changes on the scale, which he attributes to his exercise regimen.  His current medications include Ozempic , Synjardy  once daily, lisinopril  5 mg, atorvastatin  40 mg, and aspirin. He has recently refilled his cholesterol medication and lisinopril .    ROS: Per HPI  Current Outpatient Medications:    aspirin EC 81 MG tablet, Take 81 mg by mouth daily., Disp: , Rfl:    Semaglutide , 2 MG/DOSE, (OZEMPIC , 2 MG/DOSE,) 8 MG/3ML SOPN, INJECT 2 MG INTO THE SKIN ONCE A WEEK., Disp: 3 mL, Rfl: 2   atorvastatin  (LIPITOR) 40 MG tablet, Take 1 tablet (40 mg total) by mouth daily., Disp: 90 tablet, Rfl: 3   Empagliflozin -metFORMIN  HCl ER (SYNJARDY  XR) 25-1000 MG TB24, Take 1 tablet by mouth daily., Disp: 90 tablet, Rfl: 2   lisinopril  (ZESTRIL ) 5 MG tablet, Take 1 tablet (5 mg total) by mouth daily., Disp: 90 tablet, Rfl: 2  Observations/Objective: There were no vitals filed for this visit. Physical Exam Constitutional:      Appearance: Normal appearance.  HENT:     Head: Normocephalic and atraumatic.  Eyes:     Extraocular Movements: Extraocular movements intact.  Pulmonary:     Effort: Pulmonary effort is normal.  Musculoskeletal:     Cervical back: Normal range of motion.  Neurological:     General: No focal deficit present.     Mental Status: He is alert and oriented to person, place, and time.  Psychiatric:  Mood and Affect: Mood normal.        Behavior: Behavior normal.     Assessment and Plan: Dyslipidemia due to type 2 diabetes mellitus (HCC) Assessment & Plan: Blood glucose levels well-controlled. Reports symptoms at lower glucose levels. Using fingerstick monitoring. - Scheduled lab work for liver, kidney function, and A1c next Monday at 8:40 AM. - Provided Dexcom G7 and Freestyle Libre 3 sensors for trial during next week's lab  visit. - Instructed to [employer] 3 Plus app and Dexcom G7 app. - Continue current diabetes medications: Ozempic  and Synjardy . - Continue with atorvastatin  40mg  daily   Orders: -     CMP14+EGFR; Future -     Hemoglobin A1c; Future  Essential hypertension Assessment & Plan: Chronic, he will continue with lisinopril  5mg  daily.  He is encouraged to follow a low sodium diet.   Orders: -     Lisinopril ; Take 1 tablet (5 mg total) by mouth daily.  Dispense: 90 tablet; Refill: 2  Other orders -     Atorvastatin  Calcium ; Take 1 tablet (40 mg total) by mouth daily.  Dispense: 90 tablet; Refill: 3 -     Synjardy  XR; Take 1 tablet by mouth daily.  Dispense: 90 tablet; Refill: 2  General health maintenance Up to date with shingles vaccination. - Scheduled physical exam next month. - Will ensure cholesterol levels are checked during physical exam.    Follow Up Instructions: Return for 4 month dm f/u..   I discussed the assessment and treatment plan with the patient. The patient was provided an opportunity to ask questions, and all were answered. The patient agreed with the plan and demonstrated an understanding of the instructions.   The patient was advised to call back or seek an in-person evaluation if the symptoms worsen or if the condition fails to improve as anticipated.  The above assessment and management plan was discussed with the patient. The patient verbalized understanding of and has agreed to the management plan.   I, Joel LOISE Slocumb, MD, have reviewed all documentation for this visit. The documentation on 01/08/25 for the exam, diagnosis, procedures, and orders are all accurate and complete.   "

## 2025-01-09 ENCOUNTER — Ambulatory Visit: Payer: Self-pay | Admitting: Internal Medicine

## 2025-01-15 ENCOUNTER — Other Ambulatory Visit: Payer: Self-pay

## 2025-02-13 ENCOUNTER — Encounter: Payer: BC Managed Care – PPO | Admitting: Internal Medicine

## 2025-05-17 ENCOUNTER — Ambulatory Visit: Payer: Self-pay | Admitting: Internal Medicine
